# Patient Record
Sex: Female | Born: 1975
Health system: Southern US, Community
[De-identification: ages and names within clinical notes are randomized; demographics above are authoritative.]

## PROBLEM LIST (undated history)

## (undated) DIAGNOSIS — E669 Obesity, unspecified: Secondary | ICD-10-CM

## (undated) DIAGNOSIS — D500 Iron deficiency anemia secondary to blood loss (chronic): Secondary | ICD-10-CM

## (undated) HISTORY — PX: NO PAST SURGERIES: SHX2092

## (undated) HISTORY — DX: Iron deficiency anemia secondary to blood loss (chronic): D50.0

## (undated) HISTORY — DX: Obesity, unspecified: E66.9

## (undated) HISTORY — PX: WISDOM TOOTH EXTRACTION: SHX21

---

## 2010-12-27 ENCOUNTER — Inpatient Hospital Stay (INDEPENDENT_AMBULATORY_CARE_PROVIDER_SITE_OTHER)
Admission: RE | Admit: 2010-12-27 | Discharge: 2010-12-27 | Disposition: A | Discharge status: Home / Self Care | Payer: 59 | Source: Ambulatory Visit | Attending: Family Medicine | Admitting: Family Medicine

## 2010-12-27 ENCOUNTER — Encounter: Payer: Self-pay | Admitting: Family Medicine

## 2010-12-27 DIAGNOSIS — IMO0002 Reserved for concepts with insufficient information to code with codable children: Secondary | ICD-10-CM

## 2011-02-28 NOTE — Progress Notes (Signed)
Summary: left large toe injury 12-26-10 (procedure rm)   Vital Signs:  Patient Profile:   35 Years Old Female CC:      Left big toe injury yesterday Height:     64 inches Weight:      200 pounds O2 Sat:      100 % O2 treatment:    Room Air Temp:     98.6 degrees F oral Pulse rate:   90 / minute Resp:     16 per minute BP sitting:   127 / 80  (left arm) Cuff size:   regular  Vitals Entered By: Lavell Islam RN (December 27, 2010 5:21 PM)                  Prior Medication List:  No prior medications documented  Updated Prior Medication List: No Medications Current Allergies: No known allergies History of Present Illness Chief Complaint: Left big toe injury yesterday History of Present Illness:  Subjective:  Patient states that she scraped the tip of her left first toe last night on floor, resulting in small laceration/abrasion.  She washed the toe and applied Bacitracin and bandage.  Tetanus immunization current.  REVIEW OF SYSTEMS Constitutional Symptoms      Denies fever, chills, night sweats, weight loss, weight gain, and fatigue.  Eyes       Denies change in vision, eye pain, eye discharge, glasses, contact lenses, and eye surgery. Ear/Nose/Throat/Mouth       Denies hearing loss/aids, change in hearing, ear pain, ear discharge, dizziness, frequent runny nose, frequent nose bleeds, sinus problems, sore throat, hoarseness, and tooth pain or bleeding.  Respiratory       Denies dry cough, productive cough, wheezing, shortness of breath, asthma, bronchitis, and emphysema/COPD.  Cardiovascular       Denies murmurs, chest pain, and tires easily with exhertion.    Gastrointestinal       Denies stomach pain, nausea/vomiting, diarrhea, constipation, blood in bowel movements, and indigestion. Genitourniary       Denies painful urination, kidney stones, and loss of urinary control. Neurological       Denies paralysis, seizures, and fainting/blackouts. Musculoskeletal    Denies muscle pain, joint pain, joint stiffness, decreased range of motion, redness, swelling, muscle weakness, and gout.  Skin       Complains of unusual moles/lumps or sores.      Denies bruising and hair/skin or nail changes.  Psych       Denies mood changes, temper/anger issues, anxiety/stress, speech problems, depression, and sleep problems. Other Comments: left big toe injury yesterday; draining   Past History:  Past Medical History: Unremarkable  Past Surgical History: Denies surgical history  Family History: Family History Hypertension Family History Thyroid disease  Social History: Works for [employer] Never Smoked Alcohol use-no Drug use-no Smoking Status:  never Drug Use:  no   Objective:  No acute distress  Left first toe:  On medial aspect distally is a 1cm superficial skin flap that is well adhered to its base.  Toe has full range of motion, neurovascular intact.  No evidence infection.  Wound appears clean without debris. Assessment New Problems: ABRASION/FRICTION BURN FOOT&TOE W/O MENTION INF (ICD-917.0)  NO SUTURES NECESSARY.  NO EVIDENCE INFECTION  Plan New Orders: New Patient Level III [99203] Services provided After hours-Weekends-Holidays [99051] Planning Comments:   Applied bacitracin and bandage.  Advised to change bandage daily.  Return for any signs of infection.    The patient and/or caregiver  has been counseled thoroughly with regard to medications prescribed including dosage, schedule, interactions, rationale for use, and possible side effects and they verbalize understanding.  Diagnoses and expected course of recovery discussed and will return if not improved as expected or if the condition worsens. Patient and/or caregiver verbalized understanding.   Orders Added: 1)  New Patient Level III [40981] 2)  Services provided After hours-Weekends-Holidays [99051]

## 2011-03-05 ENCOUNTER — Emergency Department (INDEPENDENT_AMBULATORY_CARE_PROVIDER_SITE_OTHER)
Admission: EM | Admit: 2011-03-05 | Discharge: 2011-03-05 | Disposition: A | Discharge status: Home / Self Care | Payer: 59 | Source: Home / Self Care | Attending: Family Medicine | Admitting: Family Medicine

## 2011-03-05 ENCOUNTER — Encounter: Payer: Self-pay | Admitting: Emergency Medicine

## 2011-03-05 DIAGNOSIS — R6889 Other general symptoms and signs: Secondary | ICD-10-CM

## 2011-03-05 MED ORDER — OSELTAMIVIR PHOSPHATE 75 MG PO CAPS: 75.0000 mg | ORAL_CAPSULE | Freq: Two times a day (BID) | ORAL | Status: AC

## 2011-03-05 MED ORDER — BENZONATATE 200 MG PO CAPS: 200.0000 mg | ORAL_CAPSULE | Freq: Every day | ORAL | Status: AC

## 2011-03-05 NOTE — ED Notes (Signed)
Cough, fever, congestion x 24 hours; daughter currently has Flu and is on Tamiflu; patient had Flu Vaccine in September; took Tylenol at 0730 today.

## 2011-03-05 NOTE — ED Provider Notes (Signed)
History     CSN: [id number] Arrival date & time: 03/05/2011 10:26 AM   First MD Initiated Contact with Patient 03/05/11 1102      Chief Complaint  Patient presents with  . Nasal Congestion     HPI Comments: HPI : Flu symptoms for about 1 day. Fever to 101+ with chills, sweats, myalgias, fatigue, headache. Symptoms are progressively worsening, despite trying OTC fever reducing medicine and rest and fluids. Has decreased appetite, but tolerating some liquids by mouth.  She had a flu shot this season; her daughter was diagnosed with influenza 2 days ago, presently taking Tamiflu.  Review of Systems: Positive for fatigue, mild nasal congestion, mild sore throat, mild swollen anterior neck glands, mild cough. Negative for acute vision changes, stiff neck, focal weakness, syncope, seizures, respiratory distress, vomiting, diarrhea, GU symptoms.   The history is provided by the patient.    History reviewed. No pertinent past medical history.  History reviewed. No pertinent past surgical history.  Family History  Problem Relation Age of Onset  . Hypertension Mother     History  Substance Use Topics  . Smoking status: Never Smoker   . Smokeless tobacco: Not on file  . Alcohol Use: No    OB History    Grav Para Term Preterm Abortions TAB SAB Ect Mult Living                  Review of Systems  Allergies  Review of patient's allergies indicates no known allergies.  Home Medications   Current Outpatient Rx  Name Route Sig Dispense Refill  . BENZONATATE 200 MG PO CAPS Oral Take 1 capsule (200 mg total) by mouth at bedtime. Take as needed for cough 12 capsule 0  . OSELTAMIVIR PHOSPHATE 75 MG PO CAPS Oral Take 1 capsule (75 mg total) by mouth every 12 (twelve) hours. 10 capsule 0    BP 121/80  Pulse 115  Temp(Src) 99.7 F (37.6 C) (Oral)  Resp 16  Ht 5\' 4"  (1.626 m)  Wt 203 lb (92.08 kg)  BMI 34.84 kg/m2  SpO2 97%  LMP 02/28/2011  Physical Exam Nursing notes and  Vital Signs reviewed. Appearance:  Patient appears healthy, stated age, and in no acute distress.  Patient is obese (BMI 34.9) Eyes:  Pupils are equal, round, and reactive to light and accomodation.  Extraocular movement is intact.  Conjunctivae are not inflamed  Ears:  Canals normal.  Tympanic membranes normal.  Nose:  Mildly congested turbinates.  No sinus tenderness.   Pharynx:  Normal  Neck:  Supple.  No adenopathy is present.  Lungs:  Clear to auscultation.  Breath sounds are equal.  Heart:  Regular rate and rhythm without murmurs, rubs, or gallops.  Abdomen:  Nontender without masses or hepatosplenomegaly.  Bowel sounds are present.  No CVA or flank tenderness.  Extremities:  No edema.  Pedal pulses are full and equal.  Skin:  No rash present.    ED Course  Procedures  none      1. Influenza-like illness       MDM  Begin Tamiflu and cough suppressant at bedtime. Take Mucinex D (guaifenesin with decongestant) twice daily for congestion.  Increase fluid intake, rest. May use Afrin nasal spray (or generic oxymetazoline) twice daily for about 5 days.  Also recommend using saline nasal spray several times daily and/or saline nasal irrigation. Stop all antihistamines for now, and other non-prescription cough/cold preparations. May take Ibuprofen 200mg , 4 tabs every 8 hours  with food for fever, headache, chest/sternum discomfort, etc. Follow-up with family doctor if not improving 7 days.        Donna Christen, MD 03/07/11 Windy Fast

## 2011-05-17 ENCOUNTER — Ambulatory Visit (INDEPENDENT_AMBULATORY_CARE_PROVIDER_SITE_OTHER): Payer: 59 | Admitting: Obstetrics & Gynecology

## 2011-05-17 ENCOUNTER — Encounter: Payer: Self-pay | Admitting: Obstetrics & Gynecology

## 2011-05-17 VITALS — BP 127/78 | HR 81 | Temp 98.5°F | Resp 12 | Ht 64.0 in | Wt 197.1 lb

## 2011-05-17 DIAGNOSIS — Z113 Encounter for screening for infections with a predominantly sexual mode of transmission: Secondary | ICD-10-CM

## 2011-05-17 DIAGNOSIS — Z0000 Encounter for general adult medical examination without abnormal findings: Secondary | ICD-10-CM

## 2011-05-17 DIAGNOSIS — Z1272 Encounter for screening for malignant neoplasm of vagina: Secondary | ICD-10-CM

## 2011-05-17 NOTE — Progress Notes (Signed)
Subjective:    Joanna Hayes is a 36 y.o. MAA female who presents for an annual exam. The patient has no complaints today. The patient is sexually active. GYN screening history: last pap: was normal. The patient wears seatbelts: yes. The patient participates in regular exercise: yes. Has the patient ever been transfused or tattooed?: yes.(tattoo) The patient reports that there is not domestic violence in her life.   Menstrual History: OB History    Grav Para Term Preterm Abortions TAB SAB Ect Mult Living   2 2              Menarche age: 9 Patient's last menstrual period was 04/29/2011.    The following portions of the patient's history were reviewed and updated as appropriate: allergies, current medications, past family history, past medical history, past social history, past surgical history and problem list.  Review of Systems A comprehensive review of systems was negative. She has been married for 10 years and denies dysparunia. She has a 24 yo daughter and a 57 yo son. 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   Objective:    BP 127/78  Pulse 81  Temp(Src) 98.5 F (36.9 C) (Oral)  Resp 12  Ht 5\' 4"  (1.626 m)  Wt 197 lb 1.9 oz (89.413 kg)  BMI 33.84 kg/m2  LMP 04/29/2011  General Appearance:    Alert, cooperative, no distress, appears stated age  Head:    Normocephalic, without obvious abnormality, atraumatic  Eyes:    PERRL, conjunctiva/corneas clear, EOM's intact, fundi    benign, both eyes  Ears:    Normal TM's and external ear canals, both ears  Nose:   Nares normal, septum midline, mucosa normal, no drainage    or sinus tenderness  Throat:   Lips, mucosa, and tongue normal; teeth and gums normal  Neck:   Supple, symmetrical, trachea midline, no adenopathy;    thyroid:  no enlargement/tenderness/nodules; no carotid   bruit or JVD  Back:     Symmetric, no curvature, ROM normal, no CVA tenderness  Lungs:     Clear to auscultation bilaterally, respirations unlabored   Chest Wall:    No tenderness or deformity   Heart:    Regular rate and rhythm, S1 and S2 normal, no murmur, rub   or gallop  Breast Exam:    No tenderness, masses, or nipple abnormality  Abdomen:     Soft, non-tender, bowel sounds active all four quadrants,    no masses, no organomegaly  Genitalia:    Normal female without lesion, discharge or tenderness, NSSA, NT, no adnexal masses     Extremities:   Extremities normal, atraumatic, no cyanosis or edema  Pulses:   2+ and symmetric all extremities  Skin:   Skin color, texture, turgor normal, no rashes or lesions  Lymph nodes:   Cervical, supraclavicular, and axillary nodes normal  Neurologic:   CNII-XII intact, normal strength, sensation and reflexes    throughout  .    Assessment:    Healthy female exam.    Plan:     Mammogram. Pap smear.

## 2012-06-10 ENCOUNTER — Emergency Department
Admission: EM | Admit: 2012-06-10 | Discharge: 2012-06-10 | Disposition: A | Discharge status: Home / Self Care | Payer: 59 | Source: Home / Self Care | Attending: Family Medicine | Admitting: Family Medicine

## 2012-06-10 ENCOUNTER — Emergency Department (INDEPENDENT_AMBULATORY_CARE_PROVIDER_SITE_OTHER): Payer: 59

## 2012-06-10 DIAGNOSIS — M7520 Bicipital tendinitis, unspecified shoulder: Secondary | ICD-10-CM

## 2012-06-10 DIAGNOSIS — M25511 Pain in right shoulder: Secondary | ICD-10-CM

## 2012-06-10 DIAGNOSIS — M25519 Pain in unspecified shoulder: Secondary | ICD-10-CM

## 2012-06-10 MED ORDER — MELOXICAM 15 MG PO TABS: 15.0000 mg | ORAL_TABLET | Freq: Every day | ORAL | Status: DC

## 2012-06-10 MED ORDER — CYCLOBENZAPRINE HCL 10 MG PO TABS: ORAL_TABLET | ORAL | Status: DC

## 2012-06-10 NOTE — ED Notes (Signed)
Joanna Hayes had pulled her right shoulder 1 week ago. The pain did not last long until yesterday she pulled it again. Now the pain is stabbing and is a 9/10 with movement.

## 2012-06-10 NOTE — ED Provider Notes (Signed)
History     CSN: [id number]  Arrival date & time 06/10/12  1606   First MD Initiated Contact with Patient 06/10/12 1618      Chief Complaint  Patient presents with  . Shoulder Pain    x 1 week       HPI Comments: While playing with her husband one week ago, patient felt a pulling sensation in her right shoulder.  The pain gradually improved, then yesterday while playing with a heavy infant she had recurrent right shoulder pain   Patient is a 37 y.o. female presenting with shoulder pain. The history is provided by the patient.  Shoulder Pain This is a new problem. Episode onset: 1 week ago. The problem occurs constantly. The problem has been gradually worsening. Associated symptoms comments: none. Exacerbated by: raising right arm. Nothing relieves the symptoms. Treatments tried: Ibuprofen. The treatment provided mild relief.    Past Medical History  Diagnosis Date  . Obesity     History reviewed. No pertinent past surgical history.  Family History  Problem Relation Age of Onset  . Hypertension Mother   . Hypertension Father   . Diabetes Maternal Grandfather   . Colon cancer Paternal Grandfather     History  Substance Use Topics  . Smoking status: Never Smoker   . Smokeless tobacco: Not on file  . Alcohol Use: No    OB History   Grav Para Term Preterm Abortions TAB SAB Ect Mult Living   2 2              Review of Systems  All other systems reviewed and are negative.    Allergies  Review of patient's allergies indicates no known allergies.  Home Medications   Current Outpatient Rx  Name  Route  Sig  Dispense  Refill  . cyclobenzaprine (FLEXERIL) 10 MG tablet      Take one tab by mouth each evening   15 tablet   0   . meloxicam (MOBIC) 15 MG tablet   Oral   Take 1 tablet (15 mg total) by mouth daily. Take with food each morning   15 tablet   0     BP 116/76  Pulse 90  Temp(Src) 98.7 F (37.1 C) (Oral)  Ht 5\' 4"  (1.626 m)  Wt 206 lb (93.441  kg)  BMI 35.34 kg/m2  SpO2 96%  Physical Exam  Nursing note and vitals reviewed. Constitutional: She is oriented to person, place, and time. She appears well-developed and well-nourished. No distress.  Patient is obese (BMI 35.3)  HENT:  Head: Atraumatic.  Eyes: Conjunctivae and EOM are normal. Pupils are equal, round, and reactive to light.  Neck: Normal range of motion.  Cardiovascular: Normal heart sounds.   Pulmonary/Chest: Breath sounds normal.  Musculoskeletal:       Right shoulder: She exhibits decreased range of motion, tenderness and pain. She exhibits no bony tenderness, no swelling, no effusion, no crepitus, no deformity, no spasm, normal pulse and normal strength.  Patient has pain with abduction of right shoulder but is able to fully abduct.  She also has decreased external rotation and internal rotation (+ lift off test).  There is tenderness over insertion of the long head of biceps tendon on right. Distal neurovascular function is intact.   Neurological: She is alert and oriented to person, place, and time.  Skin: Skin is warm and dry. No rash noted.    ED Course  Procedures  none   Dg Shoulder Right  06/10/2012  *RADIOLOGY REPORT*  Clinical Data: 1-week history of right shoulder pain.  RIGHT SHOULDER - 2+ VIEW  Comparison: None.  Findings: No evidence of acute fracture or glenohumeral dislocation.  Subacromial space well preserved.  Acromioclavicular joint intact without significant degenerative change.  No intrinsic osseous abnormalities.  IMPRESSION: Normal examination.   Original Report Authenticated By: Hulan Saas, M.D.      1. Right shoulder pain;  ? Rotator cuff strain   2. Biceps tendonitis, right       MDM   Begin Mobic.  Begin Flexeril at bedtime. Apply ice pack to shoulder two or three times daily.  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 Tyson Babinski, MD 06/11/12 [phone]

## 2012-06-13 ENCOUNTER — Ambulatory Visit (INDEPENDENT_AMBULATORY_CARE_PROVIDER_SITE_OTHER): Payer: 59 | Admitting: Sports Medicine

## 2012-06-13 ENCOUNTER — Telehealth: Payer: Self-pay

## 2012-06-13 VITALS — BP 119/77 | HR 86 | Wt 210.0 lb

## 2012-06-13 DIAGNOSIS — S46819A Strain of other muscles, fascia and tendons at shoulder and upper arm level, unspecified arm, initial encounter: Secondary | ICD-10-CM

## 2012-06-13 DIAGNOSIS — S46811A Strain of other muscles, fascia and tendons at shoulder and upper arm level, right arm, initial encounter: Secondary | ICD-10-CM | POA: Insufficient documentation

## 2012-06-13 DIAGNOSIS — M5416 Radiculopathy, lumbar region: Secondary | ICD-10-CM | POA: Insufficient documentation

## 2012-06-13 NOTE — Assessment & Plan Note (Signed)
Overall improving. We were conservatively with Mobic, home PT, I would like to see her back in 3 weeks. No better we can consider injection with ultrasound scan.

## 2012-06-13 NOTE — Progress Notes (Signed)
  Subjective:    I'm seeing this patient as a consultation for:  Dr. 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 A few weeks ago she jerked her arm back causing pain in the anterior aspect of her shoulder. He got a little better but unfortunately started to get worse again. She was seen in urgent care given to rotator cuff exercises, and given some Mobic. This is overall helping the pain seems to be improving little by little.  She localized pain at the anterior joint line, worse when reaching behind her back, but not so bad with overhead activities. She denies any pain in the neck and denies any pain going down her hand causing numbness or tingling in her fingertips.  Past medical history, Surgical history, Family history not pertinant except as noted below, Social history, Allergies, and medications have been entered into the medical record, reviewed, and no changes needed.   Review of Systems: No headache, visual changes, nausea, vomiting, diarrhea, constipation, dizziness, abdominal pain, skin rash, fevers, chills, night sweats, weight loss, swollen lymph nodes, body aches, joint swelling, muscle aches, chest pain, shortness of breath, mood changes, visual or auditory hallucinations.   Objective:   General: Well Developed, well nourished, and in no acute distress.  Neuro/Psych: Alert and oriented x3, extra-ocular muscles intact, able to move all 4 extremities, sensation grossly intact. Skin: Warm and dry, no rashes noted.  Respiratory: Not using accessory muscles, speaking in full sentences, trachea midline.  Cardiovascular: Pulses palpable, no extremity edema. Abdomen: Does not appear distended. Right Shoulder: Inspection reveals no abnormalities, atrophy or asymmetry. Tender to palpation of her anterior joint line and over the bicipital groove. ROM is full in all planes. Rotator cuff strength weak to  lift off test. No signs of impingement with negative Neer and Hawkin's tests, empty can sign. Speeds and Yergason's tests normal. No labral pathology noted with negative Obrien's, negative clunk and good stability. Normal scapular function observed. No painful arc and no drop arm sign. No apprehension sign Impression and Recommendations:   This case required medical decision making of moderate complexity.

## 2012-06-13 NOTE — ED Notes (Signed)
Left a message on voice mail asking how patient is feeling and advising to call back with any questions or concerns.  

## 2012-07-06 ENCOUNTER — Ambulatory Visit (INDEPENDENT_AMBULATORY_CARE_PROVIDER_SITE_OTHER): Payer: 59 | Admitting: Sports Medicine

## 2012-07-06 ENCOUNTER — Encounter: Payer: Self-pay | Admitting: Sports Medicine

## 2012-07-06 VITALS — BP 111/76 | HR 91 | Wt 212.0 lb

## 2012-07-06 DIAGNOSIS — S46811D Strain of other muscles, fascia and tendons at shoulder and upper arm level, right arm, subsequent encounter: Secondary | ICD-10-CM

## 2012-07-06 DIAGNOSIS — S46819A Strain of other muscles, fascia and tendons at shoulder and upper arm level, unspecified arm, initial encounter: Secondary | ICD-10-CM

## 2012-07-06 NOTE — Assessment & Plan Note (Signed)
Pain resolved. Rotator cuff function is completely normal. Return on an as-needed basis. Continue home exercises.

## 2012-07-06 NOTE — Progress Notes (Signed)
   Subjective:    CC: Followup  HPI: Right subscapularis strain: Resolved with physical therapy.  Past medical history, Surgical history, Family history not pertinant except as noted below, Social history, Allergies, and medications have been entered into the medical record, reviewed, and no changes needed.   Review of Systems: No headache, visual changes, nausea, vomiting, diarrhea, constipation, dizziness, abdominal pain, skin rash, fevers, chills, night sweats, weight loss, swollen lymph nodes, body aches, joint swelling, muscle aches, chest pain, shortness of breath, mood changes, visual or auditory hallucinations.   Objective:   General: Well Developed, well nourished, and in no acute distress.  Neuro/Psych: Alert and oriented x3, extra-ocular muscles intact, able to move all 4 extremities, sensation grossly intact. Skin: Warm and dry, no rashes noted.  Respiratory: Not using accessory muscles, speaking in full sentences, trachea midline.  Cardiovascular: Pulses palpable, no extremity edema. Abdomen: Does not appear distended. Right Shoulder: Inspection reveals no abnormalities, atrophy or asymmetry. Palpation is normal with no tenderness over AC joint or bicipital groove. ROM is full in all planes. Rotator cuff strength normal throughout. No signs of impingement with negative Neer and Hawkin's tests, empty can sign. Speeds and Yergason's tests normal. No labral pathology noted with negative Obrien's, negative clunk and good stability. Normal scapular function observed. No painful arc and no drop arm sign. No apprehension sign. Impression and Recommendations:   This case required medical decision making of moderate complexity.

## 2013-01-03 ENCOUNTER — Other Ambulatory Visit (HOSPITAL_COMMUNITY)
Admission: RE | Admit: 2013-01-03 | Discharge: 2013-01-03 | Disposition: A | Discharge status: Home / Self Care | Payer: 59 | Source: Ambulatory Visit | Attending: Obstetrics & Gynecology | Admitting: Obstetrics & Gynecology

## 2013-01-03 ENCOUNTER — Ambulatory Visit (INDEPENDENT_AMBULATORY_CARE_PROVIDER_SITE_OTHER): Payer: 59 | Admitting: Obstetrics & Gynecology

## 2013-01-03 ENCOUNTER — Encounter: Payer: Self-pay | Admitting: Obstetrics & Gynecology

## 2013-01-03 VITALS — BP 127/77 | HR 93 | Resp 16 | Ht 64.0 in | Wt 212.0 lb

## 2013-01-03 DIAGNOSIS — Z1151 Encounter for screening for human papillomavirus (HPV): Secondary | ICD-10-CM

## 2013-01-03 DIAGNOSIS — Z01419 Encounter for gynecological examination (general) (routine) without abnormal findings: Secondary | ICD-10-CM

## 2013-01-03 DIAGNOSIS — Z124 Encounter for screening for malignant neoplasm of cervix: Secondary | ICD-10-CM

## 2013-01-03 DIAGNOSIS — N841 Polyp of cervix uteri: Secondary | ICD-10-CM | POA: Insufficient documentation

## 2013-01-03 NOTE — Progress Notes (Signed)
Subjective:    Joanna Hayes is a 37 y.o. female who presents for an annual exam. The patient has no complaints today. The patient is sexually active. GYN screening history: last pap: was normal. The patient wears seatbelts: yes. The patient participates in regular exercise: yes. Has the patient ever been transfused or tattooed?: yes. (tattoo) The patient reports that there is not domestic violence in her life.   Menstrual History: OB History   Grav Para Term Preterm Abortions TAB SAB Ect Mult Living   2 2              Menarche age: 78  Patient's last menstrual period was 12/11/2012.    The following portions of the patient's history were reviewed and updated as appropriate: allergies, current medications, past family history, past medical history, past social history, past surgical history and problem list.  Review of Systems A comprehensive review of systems was negative. 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 She had her flu vaccine yesterday.   Objective:    BP 127/77  Pulse 93  Resp 16  Ht 5\' 4"  (1.626 m)  Wt 212 lb (96.163 kg)  BMI 36.37 kg/m2  LMP 12/11/2012  General Appearance:    Alert, cooperative, no distress, appears stated age  Head:    Normocephalic, without obvious abnormality, atraumatic  Eyes:    PERRL, conjunctiva/corneas clear, EOM's intact, fundi    benign, both eyes  Ears:    Normal TM's and external ear canals, both ears  Nose:   Nares normal, septum midline, mucosa normal, no drainage    or sinus tenderness  Throat:   Lips, mucosa, and tongue normal; teeth and gums normal  Neck:   Supple, symmetrical, trachea midline, no adenopathy;    thyroid:  no enlargement/tenderness/nodules; no carotid   bruit or JVD  Back:     Symmetric, no curvature, ROM normal, no CVA tenderness  Lungs:     Clear to auscultation bilaterally, respirations unlabored  Chest Wall:    No tenderness or deformity   Heart:    Regular rate and rhythm, S1 and S2 normal, no murmur, rub    or gallop  Breast Exam:    No tenderness, masses, or nipple abnormality  Abdomen:     Soft, non-tender, bowel sounds active all four quadrants,    no masses, no organomegaly  Genitalia:    Normal female without lesion, discharge or tenderness, NSSA, NT, mobile, normal adnexal exam     Extremities:   Extremities normal, atraumatic, no cyanosis or edema  Pulses:   2+ and symmetric all extremities  Skin:   Skin color, texture, turgor normal, no rashes or lesions  Lymph nodes:   Cervical, supraclavicular, and axillary nodes normal  Neurologic:   CNII-XII intact, normal strength, sensation and reflexes    throughout  .    Assessment:    Healthy female exam.    Plan:     Breast self exam technique reviewed and patient encouraged to perform self-exam monthly. Mammogram. Thin prep Pap smear. with HPV cotesting

## 2013-01-03 NOTE — Patient Instructions (Signed)
Mammogram Tips Healthy women should begin getting mammograms every year or two once they reach age 35, and once a year when they reach age 50. Here are tips:  Find an experienced, high-volume center with accomplished radiologists. You can ask for their credentials.  Ask to see the certificate showing the center is approved by the U[place] Food and Drug Administration.  Use the same center regularly, so it is easier to compare your new mammograms with your old ones.  Bring a list of places you have had mammograms, dates, biopsies or other breast treatments. Bring old mammograms with you or have them sent to your primary caregiver.  Describe any breast problems to your caregiver or the person doing the mammogram. Be ready to give past surgeries, birth control pills, hormone use, breast implants, growths, moles, breast scars and family or personal history of breast cancer.  Call your doctor or center to check on the mammogram if you hear nothing within 10 days. Do not assume everything was normal.  To protect your privacy, the mammogram results cannot be given over the phone or to anyone but you.  Radiation from a mammogram is very low and does not pose a radiation risk.  Mammograms can detect breast problems other than breast cancer.  You may be asked stand or sit in front of the X-ray machine.  Two small plastic or glass plates are placed around the breast when taking the X-ray.  If you are menstruating, schedule your mammogram a week after your menstrual period.  Do not wear deodorants, powder or perfume when getting a mammogram.  Wash your breasts and under your arms before getting a mammogram.  Wear cloths that are easy for you to undress and dress.  Arrive at the center at least 15 minutes before the mammogram is scheduled.  There may be slight discomfort during the mammogram, but it goes away shortly after the test.  Try to relax as much as possible during the mammogram.  Talk  to your caregiver if you do not understand the results of the mammogram.  Follow the recommendations of your caregiver regarding further tests and treatments if needed.  Get a second opinion if you are concerned or question the results of the mammogram, further tests or treatment if needed.  Continue with monthly self-breast exams and yearly caregiver exams even if the mammogram is normal.  Your caregiver may recommend getting a mammogram before age 35 and more often if you are at high risk for developing breast cancer. Document Released: 06/30/2005 Document Revised: 06/06/2011 Document Reviewed: 03/07/2008 [employer] Patient Information 2014 [employer].  

## 2013-04-03 ENCOUNTER — Encounter: Payer: Self-pay | Admitting: Family Medicine

## 2013-04-03 ENCOUNTER — Ambulatory Visit (INDEPENDENT_AMBULATORY_CARE_PROVIDER_SITE_OTHER): Payer: 59 | Admitting: Family Medicine

## 2013-04-03 VITALS — BP 119/76 | HR 78 | Temp 98.2°F | Ht 64.0 in | Wt 212.0 lb

## 2013-04-03 DIAGNOSIS — A499 Bacterial infection, unspecified: Secondary | ICD-10-CM

## 2013-04-03 DIAGNOSIS — J329 Chronic sinusitis, unspecified: Secondary | ICD-10-CM

## 2013-04-03 DIAGNOSIS — Z13220 Encounter for screening for lipoid disorders: Secondary | ICD-10-CM

## 2013-04-03 DIAGNOSIS — Z131 Encounter for screening for diabetes mellitus: Secondary | ICD-10-CM

## 2013-04-03 DIAGNOSIS — B9689 Other specified bacterial agents as the cause of diseases classified elsewhere: Secondary | ICD-10-CM

## 2013-04-03 MED ORDER — AMOXICILLIN-POT CLAVULANATE 500-125 MG PO TABS: ORAL_TABLET | ORAL | Status: AC

## 2013-04-03 NOTE — Progress Notes (Signed)
CC: Joanna Hayes is a 38 y.o. female is here for Establish Care and Sinusitis   Subjective: HPI:  Very pleasant 38 year old limited past medical history, presents to establish care  Complains of facial pressure and nasal discharge that has been present for the last week worsening on a daily basis it appears that there is a rebound where it started to get better on the weekend only to worsen over the past 3 days significantly. Now moderate in severity. Pain is localized between the eyes radiates beneath the eyes. Pain is worse in the evening when lying Down and accompanied by subjective postnasal drip worse in the evening. Nonproductive cough in the evening only. Symptoms have not been improved with Alka-Seltzer cold or DayQuil.  Reports fever 100.1 yesterday. Denies chills or night sweats.   Review of Systems - General ROS: negative for - chills, night sweats, weight gain or weight loss Ophthalmic ROS: negative for - decreased vision Psychological ROS: negative for - anxiety or depression ENT ROS: negative for - hearing change,tinnitus or allergies Hematological and Lymphatic ROS: negative for - bleeding problems, bruising or swollen lymph nodes Breast ROS: negative Respiratory ROS: no shortness of breath, or wheezing Cardiovascular ROS: no chest pain or dyspnea on exertion Gastrointestinal ROS: no abdominal pain, change in bowel habits, or black or bloody stools Genito-Urinary ROS: negative for - genital discharge, genital ulcers, incontinence or abnormal bleeding from genitals Musculoskeletal ROS: negative for - joint pain or muscle pain Neurological ROS: negative for - headaches or memory loss Dermatological ROS: negative for lumps, mole changes, rash and skin lesion changes  Past Medical History  Diagnosis Date  . Obesity      Family History  Problem Relation Age of Onset  . Hypertension Mother   . Hypertension Father   . Diabetes Maternal Grandfather   . Colon cancer Paternal  Grandfather      History  Substance Use Topics  . Smoking status: Never Smoker   . Smokeless tobacco: Not on file  . Alcohol Use: No     Objective: Filed Vitals:   04/03/13 1317  BP: 119/76  Pulse: 78  Temp: 98.2 F (36.8 C)    General: Alert and Oriented, No Acute Distress HEENT: Pupils equal, round, reactive to light. Conjunctivae clear.  External ears unremarkable, canals clear with intact TMs with appropriate landmarks.  Left Middle ear appears open without effusion, right middle ear with mild serous effusion. Pink inferior turbinates.  Moist mucous membranes, pharynx without inflammation nor lesions however moderate cobblestoning.  Neck supple without palpable lymphadenopathy nor abnormal masses. Lungs: Clear to auscultation bilaterally, no wheezing/ronchi/rales.  Comfortable work of breathing. Good air movement. Neuro: Cranial nerves II through XII grossly intact Mental Status: No depression, anxiety, nor agitation. Skin: Warm and dry.  Assessment & Plan: Joanna Hayes was seen today for establish care and sinusitis.  Diagnoses and associated orders for this visit:  Bacterial sinusitis - amoxicillin-clavulanate (AUGMENTIN) 500-125 MG per tablet; Take one by mouth every 8 hours for ten total days.  Lipid screening - Lipid panel  Diabetes mellitus screening - BASIC METABOLIC PANEL WITH GFR    Bacterial sinusitis: Start Augmentin consider continuing over-the-counter preparation such as NyQuil in the evening to help with sleep and preventing morning symptoms, consider nasal saline washes  sReturn if symptoms worsen or fail to improve.

## 2013-10-15 ENCOUNTER — Other Ambulatory Visit: Payer: Self-pay | Admitting: Family Medicine

## 2013-10-16 ENCOUNTER — Encounter: Payer: Self-pay | Admitting: Family Medicine

## 2013-10-16 DIAGNOSIS — E785 Hyperlipidemia, unspecified: Secondary | ICD-10-CM | POA: Insufficient documentation

## 2013-10-16 LAB — LIPID PANEL
CHOLESTEROL: 179 mg/dL (ref 0–200)
HDL: 49 mg/dL (ref >39)
LDL Cholesterol: 120 mg/dL — ABNORMAL HIGH (ref 0–99)
Total CHOL/HDL Ratio: 3.7 Ratio
Triglycerides: 52 mg/dL (ref <150)
VLDL: 10 mg/dL (ref 0–40)

## 2013-10-16 LAB — BASIC METABOLIC PANEL WITH GFR
BUN: 13 mg/dL (ref 6–23)
CALCIUM: 9.2 mg/dL (ref 8.4–10.5)
CO2: 23 mEq/L (ref 19–32)
CREATININE: 0.85 mg/dL (ref 0.50–1.10)
Chloride: 106 mEq/L (ref 96–112)
GFR, EST NON AFRICAN AMERICAN: 89 mL/min
GFR, Est African American: >89 mL/min
Glucose, Bld: 91 mg/dL (ref 70–99)
Potassium: 4.2 mEq/L (ref 3.5–5.3)
Sodium: 138 mEq/L (ref 135–145)

## 2014-01-27 ENCOUNTER — Encounter: Payer: Self-pay | Admitting: Family Medicine

## 2014-06-25 ENCOUNTER — Ambulatory Visit (INDEPENDENT_AMBULATORY_CARE_PROVIDER_SITE_OTHER): Payer: 59 | Admitting: Family Medicine

## 2014-06-25 ENCOUNTER — Encounter: Payer: Self-pay | Admitting: Family Medicine

## 2014-06-25 VITALS — BP 120/76 | HR 98 | Wt 208.0 lb

## 2014-06-25 DIAGNOSIS — R0602 Shortness of breath: Secondary | ICD-10-CM

## 2014-06-25 DIAGNOSIS — R609 Edema, unspecified: Secondary | ICD-10-CM

## 2014-06-25 LAB — CBC
HCT: 37.7 % (ref 36.0–46.0)
Hemoglobin: 12.2 g/dL (ref 12.0–15.0)
MCH: 29.5 pg (ref 26.0–34.0)
MCHC: 32.4 g/dL (ref 30.0–36.0)
MCV: 91.3 fL (ref 78.0–100.0)
MPV: 9.7 fL (ref 8.6–12.4)
Platelets: 347 10*3/uL (ref 150–400)
RBC: 4.13 MIL/uL (ref 3.87–5.11)
RDW: 13.8 % (ref 11.5–15.5)
WBC: 7.7 10*3/uL (ref 4.0–10.5)

## 2014-06-25 NOTE — Progress Notes (Signed)
CC: Joanna Hayes is a 39 y.o. female is here for leg edema since sept   Subjective: HPI:  Swelling of both lower extremities that has been present for the last 6 months on a daily basis. Fluctuates from trace to mild in severity. Noticed at the end of the day. It improves if she keeps her legs elevated, it will resolve temporarily if legs are elevated for an hour or more. She denies edema or swelling elsewhere. Nothing else particularly makes it better or worse. She's never dealt with this before prior to onset above. Denies orthopnea, irregular heartbeat or chest pain. She is concerned that this could be a reflection of heart failure. Reviews of systems is positive for shortness of breath however this is only present when finding stairs and not predictable, sometimes there situations where she can climb the same flight of stairs without any shortness of breath whatsoever.   Review Of Systems Outlined In HPI  Past Medical History  Diagnosis Date  . Obesity     No past surgical history on file. Family History  Problem Relation Age of Onset  . Hypertension Mother   . Hypertension Father   . Diabetes Maternal Grandfather   . Colon cancer Paternal Grandfather     History   Social History  . Marital Status: Married    Spouse Name: N/A  . Number of Children: N/A  . Years of Education: N/A   Occupational History  . Not on file.   Social History Main Topics  . Smoking status: Never Smoker   . Smokeless tobacco: Not on file  . Alcohol Use: No  . Drug Use: No  . Sexual Activity:    Partners: Male    Birth Control/ Protection: Surgical   Other Topics Concern  . Not on file   Social History Narrative     Objective: BP 120/76 mmHg  Pulse 98  Wt 208 lb (94.348 kg)  General: Alert and Oriented, No Acute Distress HEENT: Pupils equal, round, reactive to light. Conjunctivae clear.  Moist mucous membranes Lungs: Clear to auscultation bilaterally, no wheezing/ronchi/rales.   Comfortable work of breathing. Good air movement. Cardiac: Regular rate and rhythm. Normal S1/S2.  No murmurs, rubs, nor gallops.   Extremities: Trace pitting edema in the ankles and shin, symmetric bilaterally.  Strong peripheral pulses.  Mental Status: No depression, anxiety, nor agitation. Skin: Warm and dry.  Assessment & Plan: Tasmine was seen today for leg edema since sept.  Diagnoses and all orders for this visit:  SOB (shortness of breath) Orders: -     CBC -     TSH  Edema Orders: -     CBC -     TSH   Edema: Very low suspicion for heart failure, reassurance provided based on clinical exam. Discussed most likely venous insufficiency however will rule out hypothyroidism and in some cases low blood cell counts can cause this due to decreased osmotic effect in the veins. Joint decision to not check renal function since it was perfect back in July  Return if symptoms worsen or fail to improve.

## 2014-06-26 ENCOUNTER — Telehealth: Payer: Self-pay | Admitting: Family Medicine

## 2014-06-26 LAB — TSH: TSH: 0.872 u[IU]/mL (ref 0.350–4.500)

## 2014-06-26 MED ORDER — FUROSEMIDE 20 MG PO TABS: 20.0000 mg | ORAL_TABLET | Freq: Every day | ORAL | Status: DC | PRN

## 2014-06-26 NOTE — Telephone Encounter (Signed)
Spoke with patient & gave message .Marland KitchenDirece

## 2014-06-26 NOTE — Telephone Encounter (Signed)
Seth Bake, Will you please let patient know that her blood cell counts and thyroid function were normal.  I suspect her swelling is from venous insufficiency.  This can be treated with reducing daily intake of sodium, wearing OTC compression stockings, or taking furosemide on a as needed basis.  I've sent an Rx of this to her [facility] on [address].

## 2016-01-17 DIAGNOSIS — H52223 Regular astigmatism, bilateral: Secondary | ICD-10-CM | POA: Diagnosis not present

## 2016-04-04 ENCOUNTER — Ambulatory Visit (INDEPENDENT_AMBULATORY_CARE_PROVIDER_SITE_OTHER): Payer: 59 | Admitting: Sports Medicine

## 2016-04-04 DIAGNOSIS — J029 Acute pharyngitis, unspecified: Secondary | ICD-10-CM | POA: Insufficient documentation

## 2016-04-04 LAB — POCT RAPID STREP A (OFFICE): Rapid Strep A Screen: NEGATIVE

## 2016-04-04 NOTE — Assessment & Plan Note (Signed)
No muscle aches, body aches, fatigue. Negative strep test. Ibuprofen 800 mg 3 times a day, salt water gargles, keep hydrated. Return as needed.

## 2016-04-04 NOTE — Progress Notes (Signed)
  Subjective:    CC: Sore throat  HPI: This is a pleasant 41 year old female, for the past couple of days she's had increasing sore throat, no cough, no runny nose, no muscle aches, body aches, no fevers or chills. No vomiting, diarrhea, no skin rash.  Past medical history:  Negative.  See flowsheet/record as well for more information.  Surgical history: Negative.  See flowsheet/record as well for more information.  Family history: Negative.  See flowsheet/record as well for more information.  Social history: Negative.  See flowsheet/record as well for more information.  Allergies, and medications have been entered into the medical record, reviewed, and no changes needed.   Review of Systems: No fevers, chills, night sweats, weight loss, chest pain, or shortness of breath.   Objective:    General: Well Developed, well nourished, and in no acute distress.  Neuro: Alert and oriented x3, extra-ocular muscles intact, sensation grossly intact.  HEENT: Normocephalic, atraumatic, pupils equal round reactive to light, neck supple, no masses, no lymphadenopathy, thyroid nonpalpable. Oropharynx, nasopharynx, ear canals unremarkable. Skin: Warm and dry, no rashes. Cardiac: Regular rate and rhythm, no murmurs rubs or gallops, no lower extremity edema.  Respiratory: Clear to auscultation bilaterally. Not using accessory muscles, speaking in full sentences.  Impression and Recommendations:    Viral pharyngitis No muscle aches, body aches, fatigue. Negative strep test. Ibuprofen 800 mg 3 times a day, salt water gargles, keep hydrated. Return as needed.

## 2016-04-04 NOTE — Patient Instructions (Signed)
Sore Throat A sore throat is pain, burning, irritation, or scratchiness in the throat. When you have a sore throat, you may feel pain or tenderness in your throat when you swallow or talk. Many things can cause a sore throat, including:  An infection.  Seasonal allergies.  Dryness in the air.  Irritants, such as smoke or pollution.  Gastroesophageal reflux disease (GERD).  A tumor. A sore throat is often the first sign of another sickness. It may happen with other symptoms, such as coughing, sneezing, fever, and swollen neck glands. Most sore throats go away without medical treatment. Follow these instructions at home:  Take over-the-counter medicines only as told by your health care provider.  Drink enough fluids to keep your urine clear or pale yellow.  Rest as needed.  To help with pain, try:  Sipping warm liquids, such as broth, herbal tea, or warm water.  Eating or drinking cold or frozen liquids, such as frozen ice pops.  Gargling with a salt-water mixture 3-4 times a day or as needed. To make a salt-water mixture, completely dissolve -1 tsp of salt in 1 cup of warm water.  Sucking on hard candy or throat lozenges.  Putting a cool-mist humidifier in your bedroom at night to moisten the air.  Sitting in the bathroom with the door closed for 5-10 minutes while you run hot water in the shower.  Do not use any tobacco products, such as cigarettes, chewing tobacco, and e-cigarettes. If you need help quitting, ask your health care provider. Contact a health care provider if:  You have a fever for more than 2-3 days.  You have symptoms that last (are persistent) for more than 2-3 days.  Your throat does not get better within 7 days.  You have a fever and your symptoms suddenly get worse. Get help right away if:  You have difficulty breathing.  You cannot swallow fluids, soft foods, or your saliva.  You have increased swelling in your throat or neck.  You have  persistent nausea and vomiting. This information is not intended to replace advice given to you by your health care provider. Make sure you discuss any questions you have with your health care provider. Document Released: 04/21/2004 Document Revised: 11/08/2015 Document Reviewed: 01/02/2015 Elsevier Interactive Patient Education  2017 [employer]  

## 2016-04-11 ENCOUNTER — Ambulatory Visit (INDEPENDENT_AMBULATORY_CARE_PROVIDER_SITE_OTHER): Payer: 59 | Admitting: Physician Assistant

## 2016-04-11 ENCOUNTER — Encounter: Payer: Self-pay | Admitting: Physician Assistant

## 2016-04-11 VITALS — BP 113/73 | HR 91 | Wt 200.0 lb

## 2016-04-11 DIAGNOSIS — E669 Obesity, unspecified: Secondary | ICD-10-CM | POA: Diagnosis not present

## 2016-04-11 DIAGNOSIS — M545 Low back pain, unspecified: Secondary | ICD-10-CM

## 2016-04-11 DIAGNOSIS — E785 Hyperlipidemia, unspecified: Secondary | ICD-10-CM

## 2016-04-11 DIAGNOSIS — Z0000 Encounter for general adult medical examination without abnormal findings: Secondary | ICD-10-CM | POA: Diagnosis not present

## 2016-04-11 LAB — LIPID PANEL
Cholesterol: 175 mg/dL (ref <200)
HDL: 39 mg/dL — ABNORMAL LOW (ref >50)
LDL Cholesterol: 121 mg/dL — ABNORMAL HIGH (ref <100)
TRIGLYCERIDES: 75 mg/dL (ref <150)
Total CHOL/HDL Ratio: 4.5 Ratio (ref <5.0)
VLDL: 15 mg/dL (ref <30)

## 2016-04-11 LAB — COMPREHENSIVE METABOLIC PANEL
ALT: 5 U/L — ABNORMAL LOW (ref 6–29)
AST: 13 U/L (ref 10–30)
Albumin: 4 g/dL (ref 3.6–5.1)
Alkaline Phosphatase: 75 U/L (ref 33–115)
BILIRUBIN TOTAL: 0.3 mg/dL (ref 0.2–1.2)
BUN: 13 mg/dL (ref 7–25)
CALCIUM: 9.2 mg/dL (ref 8.6–10.2)
CHLORIDE: 108 mmol/L (ref 98–110)
CO2: 23 mmol/L (ref 20–31)
Creat: 1.07 mg/dL (ref 0.50–1.10)
Glucose, Bld: 84 mg/dL (ref 65–99)
Potassium: 4.3 mmol/L (ref 3.5–5.3)
Sodium: 138 mmol/L (ref 135–146)
TOTAL PROTEIN: 7 g/dL (ref 6.1–8.1)

## 2016-04-11 LAB — CBC
HCT: 36.1 % (ref 35.0–45.0)
Hemoglobin: 11.7 g/dL (ref 11.7–15.5)
MCH: 29.1 pg (ref 27.0–33.0)
MCHC: 32.4 g/dL (ref 32.0–36.0)
MCV: 89.8 fL (ref 80.0–100.0)
MPV: 10.2 fL (ref 7.5–12.5)
PLATELETS: 349 10*3/uL (ref 140–400)
RBC: 4.02 MIL/uL (ref 3.80–5.10)
RDW: 13.6 % (ref 11.0–15.0)
WBC: 6.5 10*3/uL (ref 3.8–10.8)

## 2016-04-11 LAB — HEMOGLOBIN A1C
Hgb A1c MFr Bld: 5.4 % (ref <5.7)
Mean Plasma Glucose: 108 mg/dL

## 2016-04-11 NOTE — Progress Notes (Signed)
HPI:                                                                Joanna Hayes is a 41 y.o. female who presents to [facility]: [place] today to establish care  Patient reports intermittent midline low back pain for the past year. Pain does not radiate. Relieved with OTC pain relievers. Denies paresthesias, urinary/stool retention or incontinence. No known injury or trauma. 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   No other concerns today.  Pap: due Oct 2017 Tetanus: states it is up to date, but unsure of date  Health Maintenance Health Maintenance  Topic Date Due  . HIV Screening  11/23/1992  . TETANUS/TDAP  11/23/1996  . PAP SMEAR  01/04/2016  . INFLUENZA VACCINE  Completed    Past Medical History:  Diagnosis Date  . Obesity    No past surgical history on file. Social History  Substance Use Topics  . Smoking status: Never Smoker  . Smokeless tobacco: Not on file  . Alcohol use No   family history includes Colon cancer in her paternal grandfather; Diabetes in her maternal grandfather; Hypertension in her father and mother.  Review of Systems  Constitutional: Negative.   HENT: Negative.   Eyes: Positive for blurred vision (wears corrective lenses).  Respiratory: Negative.   Cardiovascular: Negative.   Gastrointestinal: Negative.   Genitourinary: Negative.   Musculoskeletal: Positive for back pain.  Skin: Negative.   Neurological: Negative.   Psychiatric/Behavioral: Negative.      Medications: No current outpatient prescriptions on file.   No current facility-administered medications for this visit.    No Known Allergies     Objective:  BP 113/73   Pulse 91   Wt 200 lb (90.7 kg)   BMI 34.33 kg/m  Gen: well-groomed, cooperative, not ill-appearing, no distress HEENT: normal conjunctiva, TM's clear, oropharynx clear, moist mucus membranes, no thyromegaly or tenderness Lungs: Normal work of  breathing, clear to auscultation bilaterally Heart: Normal rate, regular rhythm, s1 and s2 distinct, no murmurs, clicks or rubs appreciated on this exam, no carotid bruit Abd: Soft. Obese. Nondistended, Nontender Neuro: alert and oriented x 3, EOM's intact, PERRLA, DTR's intact MSK: strength 5/5 and symmetric, normal gait Back: no spinous process tenderness, no step deformity, no palpable spasm, full active ROM Extremities: no peripheral edema Skin: warm and dry, no rashes or lesions on exposed skin Psych: normal affect, pleasant mood, normal speech and thought content   No results found for this or any previous visit (from the past 72 hour(s)). No results found.    Assessment and Plan: 41 y.o. female with   Encounter for preventative adult health care examination - Comprehensive metabolic panel - CBC - Hemoglobin A1c - Lipid panel - VITAMIN D 25 Hydroxy (Vit-D Deficiency, Fractures) - follow-up in 1 month for Pap smear  Obesity (BMI 30.0-34.9) Dyslipidemia - 10-year ASCVD <0.5% Lab Results  Component Value Date   CHOL 179 10/15/2013   HDL 49 10/15/2013   LDLCALC 120 (H) 10/15/2013   TRIG 52 10/15/2013   CHOLHDL 3.7 10/15/2013  - lifestyle modifications  Intermittent LBP - cont OTC NSAIDs as needed - patient given stretching and strength straining exercises - return  for new or worsening symptoms  Patient education and anticipatory guidance given Patient agrees with treatment plan Follow-up in 1 month for Pap or sooner as needed  Darlyne Russian PA-C

## 2016-04-11 NOTE — Patient Instructions (Signed)
Follow-up in the next month for Pap smear   Back Pain, Adult Back pain is very common in adults.The cause of back pain is rarely dangerous and the pain often gets better over time.The cause of your back pain may not be known. Some common causes of back pain include:  Strain of the muscles or ligaments supporting the spine.  Wear and tear (degeneration) of the spinal disks.  Arthritis.  Direct injury to the back. For many people, back pain may return. Since back pain is rarely dangerous, most people can learn to manage this condition on their own. Follow these instructions at home: Watch your back pain for any changes. The following actions may help to lessen any discomfort you are feeling:  Remain active. It is stressful on your back to sit or stand in one place for long periods of time. Do not sit, drive, or stand in one place for more than 30 minutes at a time. Take short walks on even surfaces as soon as you are able.Try to increase the length of time you walk each day.  Exercise regularly as directed by your health care provider. Exercise helps your back heal faster. It also helps avoid future injury by keeping your muscles strong and flexible.  Do not stay in bed.Resting more than 1-2 days can delay your recovery.  Pay attention to your body when you bend and lift. The most comfortable positions are those that put less stress on your recovering back. Always use proper lifting techniques, including:  Bending your knees.  Keeping the load close to your body.  Avoiding twisting.  Find a comfortable position to sleep. Use a firm mattress and lie on your side with your knees slightly bent. If you lie on your back, put a pillow under your knees.  Avoid feeling anxious or stressed.Stress increases muscle tension and can worsen back pain.It is important to recognize when you are anxious or stressed and learn ways to manage it, such as with exercise.  Take medicines only as  directed by your health care provider. Over-the-counter medicines to reduce pain and inflammation are often the most helpful.Your health care provider may prescribe muscle relaxant drugs.These medicines help dull your pain so you can more quickly return to your normal activities and healthy exercise.  Apply ice to the injured area:  Put ice in a plastic bag.  Place a towel between your skin and the bag.  Leave the ice on for 20 minutes, 2-3 times a day for the first 2-3 days. After that, ice and heat may be alternated to reduce pain and spasms.  Maintain a healthy weight. Excess weight puts extra stress on your back and makes it difficult to maintain good posture. Contact a health care provider if:  You have pain that is not relieved with rest or medicine.  You have increasing pain going down into the legs or buttocks.  You have pain that does not improve in one week.  You have night pain.  You lose weight.  You have a fever or chills. Get help right away if:  You develop new bowel or bladder control problems.  You have unusual weakness or numbness in your arms or legs.  You develop nausea or vomiting.  You develop abdominal pain.  You feel faint. This information is not intended to replace advice given to you by your health care provider. Make sure you discuss any questions you have with your health care provider. Document Released: 03/14/2005 Document Revised: 07/23/2015  Document Reviewed: 07/16/2013 Elsevier Interactive Patient Education  2017 [employer].

## 2016-04-12 ENCOUNTER — Encounter: Payer: Self-pay | Admitting: Physician Assistant

## 2016-04-12 DIAGNOSIS — E559 Vitamin D deficiency, unspecified: Secondary | ICD-10-CM | POA: Insufficient documentation

## 2016-04-12 LAB — VITAMIN D 25 HYDROXY (VIT D DEFICIENCY, FRACTURES): Vit D, 25-Hydroxy: 25 ng/mL — ABNORMAL LOW (ref 30–100)

## 2016-07-12 ENCOUNTER — Ambulatory Visit (INDEPENDENT_AMBULATORY_CARE_PROVIDER_SITE_OTHER): Payer: 59 | Admitting: Obstetrics & Gynecology

## 2016-07-12 ENCOUNTER — Encounter: Payer: Self-pay | Admitting: Obstetrics & Gynecology

## 2016-07-12 VITALS — BP 112/78 | HR 98 | Ht 64.0 in | Wt 199.0 lb

## 2016-07-12 DIAGNOSIS — Z01419 Encounter for gynecological examination (general) (routine) without abnormal findings: Secondary | ICD-10-CM

## 2016-07-12 NOTE — Progress Notes (Signed)
Subjective:    Joanna Hayes is a 41 y.o. M AAP2 (17 and 18yo kids)female who presents for an annual exam. The patient has no complaints today. The patient is sexually active. GYN screening history: last pap: was normal. The patient wears seatbelts: yes. The patient participates in regular exercise: no. Has the patient ever been transfused or tattooed?: yes. The patient reports that there is not domestic violence in her life.   Menstrual History: OB History    Gravida Para Term Preterm AB Living   2 2           SAB TAB Ectopic Multiple Live Births                  Menarche age: 30 Patient's last menstrual period was 07/02/2016.    The following portions of the patient's history were reviewed and updated as appropriate: allergies, current medications, past family history, past medical history, past social history, past surgical history and problem list.  Review of Systems Pertinent items are noted in HPI.   FH- + breast cancer in a pat and mat aunt, no gyn cancer, + colon cancer in PGF   Objective:    BP 112/78   Pulse 98   Ht 5\' 4"  (1.626 m)   Wt 199 lb (90.3 kg)   LMP 07/02/2016   BMI 34.16 kg/m   General Appearance:    Alert, cooperative, no distress, appears stated age  Head:    Normocephalic, without obvious abnormality, atraumatic  Eyes:    PERRL, conjunctiva/corneas clear, EOM's intact, fundi    benign, both eyes  Ears:    Normal TM's and external ear canals, both ears  Nose:   Nares normal, septum midline, mucosa normal, no drainage    or sinus tenderness  Throat:   Lips, mucosa, and tongue normal; teeth and gums normal  Neck:   Supple, symmetrical, trachea midline, no adenopathy;    thyroid:  no enlargement/tenderness/nodules; no carotid   bruit or JVD  Back:     Symmetric, no curvature, ROM normal, no CVA tenderness  Lungs:     Clear to auscultation bilaterally, respirations unlabored  Chest Wall:    No tenderness or deformity   Heart:    Regular rate and rhythm,  S1 and S2 normal, no murmur, rub   or gallop  Breast Exam:    No tenderness, masses, or nipple abnormality  Abdomen:     Soft, non-tender, bowel sounds active all four quadrants,    no masses, no organomegaly  Genitalia:    Normal female without lesion, discharge or tenderness, NSSA, NT, no adnexal masses     Extremities:   Extremities normal, atraumatic, no cyanosis or edema  Pulses:   2+ and symmetric all extremities  Skin:   Skin color, texture, turgor normal, no rashes or lesions  Lymph nodes:   Cervical, supraclavicular, and axillary nodes normal  Neurologic:   CNII-XII intact, normal strength, sensation and reflexes    throughout  .    Assessment:    Healthy female exam.    Plan:     Thin prep Pap smear. with cotesting

## 2016-07-14 LAB — CYTOLOGY - PAP
DIAGNOSIS: NEGATIVE
HPV (WINDOPATH): NOT DETECTED

## 2017-01-18 DIAGNOSIS — H52223 Regular astigmatism, bilateral: Secondary | ICD-10-CM | POA: Diagnosis not present

## 2017-09-25 ENCOUNTER — Encounter: Payer: Self-pay | Admitting: Family Medicine

## 2017-09-25 ENCOUNTER — Ambulatory Visit: Payer: Self-pay | Admitting: Family Medicine

## 2017-09-25 VITALS — BP 100/70 | HR 92 | Temp 99.0°F | Ht 64.0 in | Wt 210.2 lb

## 2017-09-25 DIAGNOSIS — Z0000 Encounter for general adult medical examination without abnormal findings: Secondary | ICD-10-CM

## 2017-09-25 NOTE — Progress Notes (Signed)
Joanna Hayes is a 42 y.o. female who presents today with concerns of need for employer annual physical exam. She reports she has PCP and her last dental exam in the last year. She denies any chronic or acute health needs that she would like treated today.  Review of Systems  Constitutional: Negative for chills, fever and malaise/fatigue.  HENT: Negative for congestion, ear discharge, ear pain, sinus pain and sore throat.   Eyes: Negative.   Respiratory: Negative for cough, sputum production and shortness of breath.   Cardiovascular: Negative.  Negative for chest pain.  Gastrointestinal: Negative for abdominal pain, diarrhea, nausea and vomiting.  Genitourinary: Negative for dysuria, frequency, hematuria and urgency.  Musculoskeletal: Negative for myalgias.  Skin: Negative.   Neurological: Negative for headaches.  Endo/Heme/Allergies: Negative.   Psychiatric/Behavioral: Negative.     O: Vitals:   09/25/17 1221  BP: 100/70  Pulse: 92  Temp: 99 F (37.2 C)  SpO2: 97%     Physical Exam  Constitutional: She is oriented to person, place, and time. Vital signs are normal. She appears well-developed and well-nourished. She is active.  Non-toxic appearance. She does not have a sickly appearance.  HENT:  Head: Normocephalic.  Right Ear: Hearing, tympanic membrane, external ear and ear canal normal.  Left Ear: Hearing, tympanic membrane, external ear and ear canal normal.  Nose: Nose normal.  Mouth/Throat: Uvula is midline and oropharynx is clear and moist.  Neck: Normal range of motion. Neck supple.  Cardiovascular: Normal rate, regular rhythm, normal heart sounds and normal pulses.  Pulmonary/Chest: Effort normal and breath sounds normal.  Abdominal: Soft. Bowel sounds are normal.  Musculoskeletal: Normal range of motion.  Lymphadenopathy:       Head (right side): No submental and no submandibular adenopathy present.       Head (left side): No submental and no submandibular  adenopathy present.    She has no cervical adenopathy.  Neurological: She is alert and oriented to person, place, and time.  Psychiatric: She has a normal mood and affect. Her speech is normal and behavior is normal. Cognition and memory are normal.  PHQ-9- negative  Vitals reviewed.    A: 1. Physical exam      P: Exam findings, diagnosis etiology and medication use and indications reviewed with patient. Follow- Up and discharge instructions provided. No emergent/urgent issues found on exam.  Patient verbalized understanding of information provided and agrees with plan of care (POC), all questions answered.  1. Physical exam WN:L

## 2017-09-25 NOTE — Patient Instructions (Signed)

## 2017-11-06 ENCOUNTER — Encounter: Payer: Self-pay | Admitting: Certified Nurse Midwife

## 2017-11-06 ENCOUNTER — Ambulatory Visit (INDEPENDENT_AMBULATORY_CARE_PROVIDER_SITE_OTHER): Payer: 59 | Admitting: Certified Nurse Midwife

## 2017-11-06 VITALS — BP 113/71 | HR 82 | Resp 16 | Ht 64.0 in | Wt 210.0 lb

## 2017-11-06 DIAGNOSIS — Z0000 Encounter for general adult medical examination without abnormal findings: Secondary | ICD-10-CM

## 2017-11-06 DIAGNOSIS — Z01419 Encounter for gynecological examination (general) (routine) without abnormal findings: Secondary | ICD-10-CM

## 2017-11-06 NOTE — Progress Notes (Signed)
Gynecology Annual Exam  History of Present Illness: Patient is a 42 y.o. G2P2 presents for annual exam. The patient has no complaints today. The patient is sexually active. She denies dyspareunia. The patient does perform self breast exams. There is no notable family history of breast or ovarian cancer in her family. The patient denies current symptoms of depression.  Past Medical History:  Past Medical History:  Diagnosis Date  . Obesity     Past Surgical History:  History reviewed. No pertinent surgical history.  Gynecologic History:  LMP: Patient's last menstrual period was 10/21/2017. Heavy Menses: no Intermenstrual Bleeding: no Postcoital Bleeding: no Dysmenorrhea: no Contraception: vasectomy Last Pap: completed on 07/12/16 ; result was: NIL and HR HPV negative  Mammogram: last completed on 2012, result was 1.5 cm fibroadenoma-left breast.  Obstetric History: G2P2  Family History:  Family History  Problem Relation Age of Onset  . Hypertension Mother   . Hypertension Father   . Diabetes Maternal Grandfather   . Colon cancer Paternal Grandfather     Social History:  Social History   Socioeconomic History  . Marital status: Married    Spouse name: Not on file  . Number of children: Not on file  . Years of education: Not on file  . Highest education level: Not on file  Occupational History  . Not on file  Social Needs  . Financial resource strain: Not on file  . Food insecurity:    Worry: Not on file    Inability: Not on file  . Transportation needs:    Medical: Not on file    Non-medical: Not on file  Tobacco Use  . Smoking status: Never Smoker  . Smokeless tobacco: Never Used  Substance and Sexual Activity  . Alcohol use: No  . Drug use: No  . Sexual activity: Yes    Partners: Male    Birth control/protection: None    Comment: Husband Vasectomy  Lifestyle  . Physical activity:    Days per week: Not on file    Minutes per session: Not on file  .  Stress: Not on file  Relationships  . Social connections:    Talks on phone: Not on file    Gets together: Not on file    Attends religious service: Not on file    Active member of club or organization: Not on file    Attends meetings of clubs or organizations: Not on file    Relationship status: Not on file  . Intimate partner violence:    Fear of current or ex partner: Not on file    Emotionally abused: Not on file    Physically abused: Not on file    Forced sexual activity: Not on file  Other Topics Concern  . Not on file  Social History Narrative  . Not on file    Allergies:  No Known Allergies  Medications: Prior to Admission medications   Medication Sig Start Date End Date Taking? Authorizing Provider  cetirizine (ZYRTEC) 10 MG chewable tablet Chew 10 mg by mouth daily.   Yes [provider]  cholecalciferol (VITAMIN D) 1000 units tablet Take 1,000 Units by mouth daily.    [provider]    Review of Systems: negative except noted in HPI  Physical Exam Vitals: Blood pressure 113/71, pulse 82, resp. rate 16, height 5\' 4"  (1.626 m), weight 95.3 kg, last menstrual period 10/21/2017. General: NAD HEENT: normocephalic, atraumatic Thyroid: no enlargement, no palpable nodules Pulmonary: Normal rate and  effort, CTAB Cardiovascular: RRR Breast: Breast symmetrical, no tenderness, no palpable nodules or masses, no skin or nipple retraction present, no nipple discharge. No axillary or supraclavicular lymphadenopathy. Abdomen: soft, non-tender, non-distended. No hepatomegaly, splenomegaly or masses palpable. No evidence of hernia  Genitourinary: deferred Extremities: no edema, erythema, or tenderness Neurologic: Grossly intact Psychiatric: mood appropriate, affect full  Female chaperone present for breast portion of the physical exam  Assessment:  1. Well woman exam     Plan: Mammogram 1 month Follow up with GYN in 1 year or prn Follow up with PCP  soon for annual visit  Julianne Handler, [place] 11/06/2017 10:07 AM

## 2017-11-08 ENCOUNTER — Ambulatory Visit: Payer: 59

## 2017-12-11 ENCOUNTER — Other Ambulatory Visit: Payer: Self-pay

## 2017-12-11 DIAGNOSIS — D242 Benign neoplasm of left breast: Secondary | ICD-10-CM

## 2017-12-15 ENCOUNTER — Ambulatory Visit
Admission: RE | Admit: 2017-12-15 | Discharge: 2017-12-15 | Disposition: A | Discharge status: Home / Self Care | Payer: 59 | Source: Ambulatory Visit | Attending: Certified Nurse Midwife | Admitting: Certified Nurse Midwife

## 2017-12-15 DIAGNOSIS — D242 Benign neoplasm of left breast: Secondary | ICD-10-CM

## 2017-12-15 DIAGNOSIS — N6489 Other specified disorders of breast: Secondary | ICD-10-CM | POA: Diagnosis not present

## 2017-12-15 DIAGNOSIS — R928 Other abnormal and inconclusive findings on diagnostic imaging of breast: Secondary | ICD-10-CM | POA: Diagnosis not present

## 2018-02-10 DIAGNOSIS — H52223 Regular astigmatism, bilateral: Secondary | ICD-10-CM | POA: Diagnosis not present

## 2018-04-23 ENCOUNTER — Ambulatory Visit (INDEPENDENT_AMBULATORY_CARE_PROVIDER_SITE_OTHER): Payer: 59 | Admitting: Physician Assistant

## 2018-04-23 ENCOUNTER — Encounter: Payer: Self-pay | Admitting: Physician Assistant

## 2018-04-23 VITALS — BP 120/81 | HR 80 | Wt 201.0 lb

## 2018-04-23 DIAGNOSIS — Z8349 Family history of other endocrine, nutritional and metabolic diseases: Secondary | ICD-10-CM | POA: Diagnosis not present

## 2018-04-23 DIAGNOSIS — E785 Hyperlipidemia, unspecified: Secondary | ICD-10-CM | POA: Diagnosis not present

## 2018-04-23 DIAGNOSIS — Z0000 Encounter for general adult medical examination without abnormal findings: Secondary | ICD-10-CM

## 2018-04-23 DIAGNOSIS — Z6834 Body mass index (BMI) 34.0-34.9, adult: Secondary | ICD-10-CM | POA: Diagnosis not present

## 2018-04-23 DIAGNOSIS — E559 Vitamin D deficiency, unspecified: Secondary | ICD-10-CM | POA: Diagnosis not present

## 2018-04-23 DIAGNOSIS — E6609 Other obesity due to excess calories: Secondary | ICD-10-CM

## 2018-04-23 DIAGNOSIS — Z30011 Encounter for initial prescription of contraceptive pills: Secondary | ICD-10-CM

## 2018-04-23 DIAGNOSIS — Z01419 Encounter for gynecological examination (general) (routine) without abnormal findings: Secondary | ICD-10-CM | POA: Insufficient documentation

## 2018-04-23 MED ORDER — DESOGESTREL-ETHINYL ESTRADIOL 0.15-0.02/0.01 MG (21/5) PO TABS
1.0000 | ORAL_TABLET | Freq: Every day | ORAL | 4 refills | Status: DC
Start: 2018-04-23 — End: 2019-11-26

## 2018-04-23 MED FILL — PIMTREA 28 DAY TABLET: 0.15-0.02/0 | 84 days supply | Qty: 84 | Fill #0

## 2018-04-23 NOTE — Progress Notes (Signed)
HPI:                                                                Joanna Hayes is a 43 y.o. female who presents to [facility]: [place] today for annual physical exam  Current concerns: none  Exercises 3 days per week for at least 30 minutes, mixture of cardio and strength training. She has lost 10 pounds in the last 5 months.  She would like to start birth control pill for contraception. Sexually active, 1 female partner. Denies hx of STI or known exposure. Currently uses condoms.  Depression screen PHQ 2/9 04/23/2018  Decreased Interest 0  Down, Depressed, Hopeless 0  PHQ - 2 Score 0     Past Medical History:  Diagnosis Date  . Obesity    Past Surgical History:  Procedure Laterality Date  . NO PAST SURGERIES     Social History   Tobacco Use  . Smoking status: Never Smoker  . Smokeless tobacco: Never Used  Substance Use Topics  . Alcohol use: No   family history includes Breast cancer in her maternal uncle and paternal aunt; Colon cancer in her paternal grandfather; Diabetes in her maternal grandfather; Hypertension in her father and mother; Prostate cancer in her father; Thyroid disease in her brother and sister.    ROS: negative except as noted in the HPI  Medications: Current Outpatient Medications  Medication Sig Dispense Refill  . Ascorbic Acid (VITAMIN C PO) Take by mouth.    . cetirizine (ZYRTEC) 10 MG chewable tablet Chew 10 mg by mouth daily.    . cholecalciferol (VITAMIN D) 1000 units tablet Take 1,000 Units by mouth daily.    Marland Kitchen VITAMIN E PO Take by mouth.     No current facility-administered medications for this visit.    No Known Allergies     Objective:  BP 120/81   Pulse 80   Wt 201 lb (91.2 kg)   LMP 03/27/2018   BMI 34.50 kg/m  General Appearance:  Alert, cooperative, no distress, appropriate for age, obese female                            Head:  Normocephalic, without obvious abnormality                      Eyes:  PERRL, EOM's intact, conjunctiva and cornea clear, wearing glasses                             Ears:  TM pearly gray color and semitransparent, external ear canals normal, both ears                            Nose:  Nares symmetrical, mucosa pink                          Throat:  Lips, tongue, and mucosa are moist, pink, and intact; oropharynx clear, uvula midline; good dentition  Neck:  Supple; symmetrical, trachea midline, no adenopathy; thyroid: no enlargement, symmetric, no tenderness/mass/nodules                             Back:  Symmetrical, no curvature, ROM normal               Chest/Breast:  deferred                           Lungs:  Clear to auscultation bilaterally, respirations unlabored                             Heart:  normal rate & regular rhythm, S1 and S2 normal, no murmurs, rubs, or gallops                     Abdomen:  Soft, non-tender, no mass or organomegaly              Genitourinary:  deferred         Musculoskeletal:  Tone and strength strong and symmetrical, all extremities; no joint pain or edema, normal gait and station                                   Lymphatic:  No adenopathy             Skin/Hair/Nails:  Skin warm, dry and intact, no rashes or abnormal dyspigmentation on limited exam                   Neurologic:  Alert and oriented x3, no cranial nerve deficits,  sensation grossly intact, normal gait and station, no tremor Psych: well-groomed, cooperative, good eye contact, euthymic mood, affect mood-congruent, speech is articulate, and thought processes clear and goal-directed  Wt Readings from Last 3 Encounters:  04/23/18 201 lb (91.2 kg)  11/06/17 210 lb (95.3 kg)  09/25/17 210 lb 3.2 oz (95.3 kg)     No results found for this or any previous visit (from the past 72 hour(s)). No results found.    Assessment and Plan: 43 y.o. female with   .Joanna Hayes was seen today for annual exam.  Diagnoses and  all orders for this visit:  Encounter for annual physical exam -     CBC -     COMPLETE METABOLIC PANEL WITH GFR -     Lipid Panel w/reflex Direct LDL -     VITAMIN D 25 Hydroxy (Vit-D Deficiency, Fractures)  Dyslipidemia (high LDL; low HDL) -     Lipid Panel w/reflex Direct LDL  Vitamin D insufficiency -     VITAMIN D 25 Hydroxy (Vit-D Deficiency, Fractures)  Family history of thyroid disease -     TSH + free T4  Class 1 obesity due to excess calories with body mass index (BMI) of 34.0 to 34.9 in adult, unspecified whether serious comorbidity present  Encounter for initial prescription of contraceptive pills -     desogestrel-ethinyl estradiol (KARIVA,AZURETTE,MIRCETTE) 0.15-0.02/0.01 MG (21/5) tablet; Take 1 tablet by mouth daily.  Other orders -     Cancel: MM 3D SCREEN BREAST BILATERAL; Future   - Personally reviewed PMH, PSH, PFH, medications, allergies, HM - Age-appropriate cancer screening: Yearly mammogram UTD 12/15/17; Pap UTD 07/12/16, NILM, HPV neg - Influenza declined - Tdap UTD  per patient - PHQ2 negative - Routine fasting labs pending - Applauded for weight loss efforts. Encouraged to continue decreasing caloric intake and increasing aerobic exercise  Initial prescription of OCP LMP 03/27/18, uses condoms consistently, deferred urine hCG Discussed contraceptive options including LARCs. Patient opted for COC Recheck BP in 1 month   Patient education and anticipatory guidance given Patient agrees with treatment plan Follow-up in 1 month for nurse BP check or sooner as needed if symptoms worsen or fail to improve  Darlyne Russian PA-C

## 2018-04-23 NOTE — Patient Instructions (Signed)
Preventive Care 40-64 Years, Female Preventive care refers to lifestyle choices and visits with your health care provider that can promote health and wellness. What does preventive care include?   A yearly physical exam. This is also called an annual well check.  Dental exams once or twice a year.  Routine eye exams. Ask your health care provider how often you should have your eyes checked.  Personal lifestyle choices, including: ? Daily care of your teeth and gums. ? Regular physical activity. ? Eating a healthy diet. ? Avoiding tobacco and drug use. ? Limiting alcohol use. ? Practicing safe sex. ? Taking low-dose aspirin daily starting at age 50. ? Taking vitamin and mineral supplements as recommended by your health care provider. What happens during an annual well check? The services and screenings done by your health care provider during your annual well check will depend on your age, overall health, lifestyle risk factors, and family history of disease. Counseling Your health care provider may ask you questions about your:  Alcohol use.  Tobacco use.  Drug use.  Emotional well-being.  Home and relationship well-being.  Sexual activity.  Eating habits.  Work and work environment.  Method of birth control.  Menstrual cycle.  Pregnancy history. Screening You may have the following tests or measurements:  Height, weight, and BMI.  Blood pressure.  Lipid and cholesterol levels. These may be checked every 5 years, or more frequently if you are over 50 years old.  Skin check.  Lung cancer screening. You may have this screening every year starting at age 55 if you have a 30-pack-year history of smoking and currently smoke or have quit within the past 15 years.  Colorectal cancer screening. All adults should have this screening starting at age 50 and continuing until age 75. Your health care provider may recommend screening at age 45. You will have tests every  1-10 years, depending on your results and the type of screening test. People at increased risk should start screening at an earlier age. Screening tests may include: ? Guaiac-based fecal occult blood testing. ? Fecal immunochemical test (FIT). ? Stool DNA test. ? Virtual colonoscopy. ? Sigmoidoscopy. During this test, a flexible tube with a tiny camera (sigmoidoscope) is used to examine your rectum and lower colon. The sigmoidoscope is inserted through your anus into your rectum and lower colon. ? Colonoscopy. During this test, a long, thin, flexible tube with a tiny camera (colonoscope) is used to examine your entire colon and rectum.  Hepatitis C blood test.  Hepatitis B blood test.  Sexually transmitted disease (STD) testing.  Diabetes screening. This is done by checking your blood sugar (glucose) after you have not eaten for a while (fasting). You may have this done every 1-3 years.  Mammogram. This may be done every 1-2 years. Talk to your health care provider about when you should start having regular mammograms. This may depend on whether you have a family history of breast cancer.  BRCA-related cancer screening. This may be done if you have a family history of breast, ovarian, tubal, or peritoneal cancers.  Pelvic exam and Pap test. This may be done every 3 years starting at age 21. Starting at age 30, this may be done every 5 years if you have a Pap test in combination with an HPV test.  Bone density scan. This is done to screen for osteoporosis. You may have this scan if you are at high risk for osteoporosis. Discuss your test results, treatment options,   and if necessary, the need for more tests with your health care provider. Vaccines Your health care provider may recommend certain vaccines, such as:  Influenza vaccine. This is recommended every year.  Tetanus, diphtheria, and acellular pertussis (Tdap, Td) vaccine. You may need a Td booster every 10 years.  Varicella  vaccine. You may need this if you have not been vaccinated.  Zoster vaccine. You may need this after age 38.  Measles, mumps, and rubella (MMR) vaccine. You may need at least one dose of MMR if you were born in 1957 or later. You may also need a second dose.  Pneumococcal 13-valent conjugate (PCV13) vaccine. You may need this if you have certain conditions and were not previously vaccinated.  Pneumococcal polysaccharide (PPSV23) vaccine. You may need one or two doses if you smoke cigarettes or if you have certain conditions.  Meningococcal vaccine. You may need this if you have certain conditions.  Hepatitis A vaccine. You may need this if you have certain conditions or if you travel or work in places where you may be exposed to hepatitis A.  Hepatitis B vaccine. You may need this if you have certain conditions or if you travel or work in places where you may be exposed to hepatitis B.  Haemophilus influenzae type b (Hib) vaccine. You may need this if you have certain conditions. Talk to your health care provider about which screenings and vaccines you need and how often you need them. This information is not intended to replace advice given to you by your health care provider. Make sure you discuss any questions you have with your health care provider. Document Released: 04/10/2015 Document Revised: 05/04/2017 Document Reviewed: 01/13/2015 Elsevier Interactive Patient Education  2019 [employer].

## 2018-04-24 DIAGNOSIS — E785 Hyperlipidemia, unspecified: Secondary | ICD-10-CM | POA: Diagnosis not present

## 2018-04-24 DIAGNOSIS — Z8349 Family history of other endocrine, nutritional and metabolic diseases: Secondary | ICD-10-CM | POA: Diagnosis not present

## 2018-04-24 DIAGNOSIS — E559 Vitamin D deficiency, unspecified: Secondary | ICD-10-CM | POA: Diagnosis not present

## 2018-04-24 DIAGNOSIS — Z0000 Encounter for general adult medical examination without abnormal findings: Secondary | ICD-10-CM | POA: Diagnosis not present

## 2018-04-24 LAB — TSH+FREE T4: TSH W/REFLEX TO FT4: 0.61 mIU/L

## 2018-04-25 LAB — COMPLETE METABOLIC PANEL WITH GFR
AG RATIO: 1.3 (calc) (ref 1.0–2.5)
ALKALINE PHOSPHATASE (APISO): 79 U/L (ref 33–115)
ALT: 9 U/L (ref 6–29)
AST: 13 U/L (ref 10–30)
Albumin: 3.6 g/dL (ref 3.6–5.1)
BUN: 15 mg/dL (ref 7–25)
CO2: 25 mmol/L (ref 20–32)
Calcium: 8.9 mg/dL (ref 8.6–10.2)
Chloride: 109 mmol/L (ref 98–110)
Creat: 0.9 mg/dL (ref 0.50–1.10)
GFR, Est African American: 93 mL/min/{1.73_m2} (ref >=60)
GFR, Est Non African American: 80 mL/min/{1.73_m2} (ref >=60)
Globulin: 2.8 g/dL (calc) (ref 1.9–3.7)
Glucose, Bld: 105 mg/dL — ABNORMAL HIGH (ref 65–99)
POTASSIUM: 3.6 mmol/L (ref 3.5–5.3)
Sodium: 140 mmol/L (ref 135–146)
Total Bilirubin: 0.3 mg/dL (ref 0.2–1.2)
Total Protein: 6.4 g/dL (ref 6.1–8.1)

## 2018-04-25 LAB — CBC
HEMATOCRIT: 34.1 % — AB (ref 35.0–45.0)
Hemoglobin: 11.5 g/dL — ABNORMAL LOW (ref 11.7–15.5)
MCH: 30 pg (ref 27.0–33.0)
MCHC: 33.7 g/dL (ref 32.0–36.0)
MCV: 89 fL (ref 80.0–100.0)
MPV: 11.2 fL (ref 7.5–12.5)
PLATELETS: 294 10*3/uL (ref 140–400)
RBC: 3.83 10*6/uL (ref 3.80–5.10)
RDW: 13.3 % (ref 11.0–15.0)
WBC: 4.5 10*3/uL (ref 3.8–10.8)

## 2018-04-25 LAB — LIPID PANEL W/REFLEX DIRECT LDL
CHOLESTEROL: 164 mg/dL (ref <200)
HDL: 38 mg/dL — AB (ref >50)
LDL Cholesterol (Calc): 112 mg/dL (calc) — ABNORMAL HIGH
Non-HDL Cholesterol (Calc): 126 mg/dL (calc) (ref <130)
Total CHOL/HDL Ratio: 4.3 (calc) (ref <5.0)
Triglycerides: 56 mg/dL (ref <150)

## 2018-04-25 LAB — VITAMIN D 25 HYDROXY (VIT D DEFICIENCY, FRACTURES): VIT D 25 HYDROXY: 32 ng/mL (ref 30–100)

## 2018-04-27 ENCOUNTER — Encounter: Payer: Self-pay | Admitting: Physician Assistant

## 2018-04-27 DIAGNOSIS — D500 Iron deficiency anemia secondary to blood loss (chronic): Secondary | ICD-10-CM

## 2018-04-27 HISTORY — DX: Iron deficiency anemia secondary to blood loss (chronic): D50.0

## 2018-04-30 ENCOUNTER — Encounter: Payer: Self-pay | Admitting: Physician Assistant

## 2018-05-07 ENCOUNTER — Encounter: Payer: Self-pay | Admitting: Physician Assistant

## 2018-05-07 ENCOUNTER — Ambulatory Visit (INDEPENDENT_AMBULATORY_CARE_PROVIDER_SITE_OTHER): Payer: 59

## 2018-05-07 ENCOUNTER — Ambulatory Visit (INDEPENDENT_AMBULATORY_CARE_PROVIDER_SITE_OTHER): Payer: 59 | Admitting: Physician Assistant

## 2018-05-07 VITALS — BP 123/77 | HR 110 | Temp 100.3°F | Wt 197.0 lb

## 2018-05-07 DIAGNOSIS — J989 Respiratory disorder, unspecified: Secondary | ICD-10-CM | POA: Diagnosis not present

## 2018-05-07 DIAGNOSIS — R509 Fever, unspecified: Secondary | ICD-10-CM

## 2018-05-07 DIAGNOSIS — R05 Cough: Secondary | ICD-10-CM

## 2018-05-07 MED ORDER — DOXYCYCLINE HYCLATE 100 MG PO TABS: 100.0000 mg | ORAL_TABLET | Freq: Two times a day (BID) | ORAL | 0 refills | Status: AC

## 2018-05-07 MED ORDER — HYDROCOD POLST-CPM POLST ER 10-8 MG/5ML PO SUER: 5.0000 mL | Freq: Two times a day (BID) | ORAL | 0 refills | Status: AC | PRN

## 2018-05-07 NOTE — Progress Notes (Signed)
HPI:                                                                Nidhi Jacome is a 43 y.o. female who presents to [place]: [place] today for cough  Cough  This is a new problem. The current episode started 1 to 4 weeks ago. The problem has been gradually worsening. The cough is non-productive. Associated symptoms include chills, a fever and nasal congestion. Pertinent negatives include no chest pain, ear pain, hemoptysis, rash, shortness of breath or wheezing. Nothing aggravates the symptoms. She has tried OTC cough suppressant and rest for the symptoms.     Past Medical History:  Diagnosis Date  . Normocytic anemia due to blood loss 04/27/2018  . Obesity    Past Surgical History:  Procedure Laterality Date  . NO PAST SURGERIES     Social History   Tobacco Use  . Smoking status: Never Smoker  . Smokeless tobacco: Never Used  Substance Use Topics  . Alcohol use: No   family history includes Breast cancer in her maternal uncle and paternal aunt; Colon cancer in her paternal grandfather; Diabetes in her maternal grandfather; Hypertension in her father and mother; Prostate cancer in her father; Thyroid disease in her brother and sister.    ROS: Review of Systems  Constitutional: Positive for chills, fever and malaise/fatigue.  HENT: Positive for congestion and sinus pain. Negative for ear pain.   Respiratory: Positive for cough. Negative for hemoptysis, sputum production, shortness of breath and wheezing.   Cardiovascular: Negative for chest pain.  Gastrointestinal: Negative for abdominal pain, nausea and vomiting.  Skin: Negative for rash.  All other systems reviewed and are negative.    Medications: Current Outpatient Medications  Medication Sig Dispense Refill  . Ascorbic Acid (VITAMIN C PO) Take by mouth.    . cetirizine (ZYRTEC) 10 MG chewable tablet Chew 10 mg by mouth daily.    . cholecalciferol (VITAMIN D) 1000 units  tablet Take 1,000 Units by mouth daily.    Marland Kitchen desogestrel-ethinyl estradiol (KARIVA,AZURETTE,MIRCETTE) 0.15-0.02/0.01 MG (21/5) tablet Take 1 tablet by mouth daily. 3 Package 4  . VITAMIN E PO Take by mouth.     No current facility-administered medications for this visit.    No Known Allergies     Objective:  BP 123/77   Pulse (!) 110   Temp 100.3 F (37.9 C) (Oral)   Wt 197 lb (89.4 kg)   SpO2 95%   BMI 33.81 kg/m  Gen:  alert, ill-appearing, not toxic-appearing, no distress, appropriate for age 11: head normocephalic without obvious abnormality, conjunctiva and cornea clear, wearing glasses, TM's pearly gray and semi-transparent, nasal mucosa edematous, oropharynx clear, neck supple, no cervical adenopathy, trachea midline Pulm: Normal work of breathing, normal phonation, clear to auscultation bilaterally, no wheezes, rales or rhonchi CV: mildly tachycardic rate, regular rhythm, s1 and s2 distinct, no murmurs, clicks or rubs  Neuro: alert and oriented x 3, no tremor MSK: extremities atraumatic, normal gait and station Skin: intact, no rashes on exposed skin, no jaundice, no cyanosis    No results found for this or any previous visit (from the past 72 hour(s)). No results found.    Assessment and Plan: 43 y.o. female with   .  Anika was seen today for cough.  Diagnoses and all orders for this visit:  Febrile respiratory illness -     DG Chest 2 View -     doxycycline (VIBRA-TABS) 100 MG tablet; Take 1 tablet (100 mg total) by mouth 2 (two) times daily for 7 days. -     chlorpheniramine-HYDROcodone ([place]) 10-8 MG/5ML SUER; Take 5 mLs by mouth every 12 (twelve) hours as needed for up to 5 days for cough.   Febrile at 100.3, tachycardic at 110, pulse ox reduced at 95% on RA at rest, no adventitious lung sounds, no tachypnea CXR pending to assess for infiltrate Will cover for CAP and bacterial sinusitis with Doxycycline. If CXR shows infiltrate, will have her  follow-up in the office in 1 week to ensure resolution of symptoms and obtain repeat CXR in 6 weeks Tussionex QHS prn for nocturnal cough   Patient education and anticipatory guidance given Patient agrees with treatment plan Follow-up as needed if symptoms worsen or fail to improve  Darlyne Russian PA-C

## 2018-05-07 NOTE — Patient Instructions (Signed)
Community-Acquired Pneumonia, Adult °Pneumonia is an infection of the lungs. There are different types of pneumonia. One type can develop while a person is in a hospital. A different type, called community-acquired pneumonia, develops in people who are not, or have not recently been, in the hospital or other health care facility. °What are the causes? ° °Pneumonia may be caused by bacteria, viruses, or funguses. Community-acquired pneumonia is often caused by Streptococcus pneumonia bacteria. These bacteria are often passed from one person to another by breathing in droplets from the cough or sneeze of an infected person. °What increases the risk? °The condition is more likely to develop in: °· People who have chronic diseases, such as chronic obstructive pulmonary disease (COPD), asthma, congestive heart failure, cystic fibrosis, diabetes, or kidney disease. °· People who have early-stage or late-stage HIV. °· People who have sickle cell disease. °· People who have had their spleen removed (splenectomy). °· People who have poor dental hygiene. °· People who have medical conditions that increase the risk of breathing in (aspirating) secretions their own mouth and nose. °· People who have a weakened immune system (immunocompromised). °· People who smoke. °· People who travel to areas where pneumonia-causing germs commonly exist. °· People who are around animal habitats or animals that have pneumonia-causing germs, including birds, bats, rabbits, cats, and farm animals. °What are the signs or symptoms? °Symptoms of this condition include: °· A dry cough. °· A wet (productive) cough. °· Fever. °· Sweating. °· Chest pain, especially when breathing deeply or coughing. °· Rapid breathing or difficulty breathing. °· Shortness of breath. °· Shaking chills. °· Fatigue. °· Muscle aches. °How is this diagnosed? °Your health care provider will take a medical history and perform a physical exam. You may also have other tests,  including: °· Imaging studies of your chest, including X-rays. °· Tests to check your blood oxygen level and other blood gases. °· Other tests on blood, mucus (sputum), fluid around your lungs (pleural fluid), and urine. °If your pneumonia is severe, other tests may be done to identify the specific cause of your illness. °How is this treated? °The type of treatment that you receive depends on many factors, such as the cause of your pneumonia, the medicines you take, and other medical conditions that you have. For most adults, treatment and recovery from pneumonia may occur at home. In some cases, treatment must happen in a hospital. Treatment may include: °· Antibiotic medicines, if the pneumonia was caused by bacteria. °· Antiviral medicines, if the pneumonia was caused by a virus. °· Medicines that are given by mouth or through an IV tube. °· Oxygen. °· Respiratory therapy. °Although rare, treating severe pneumonia may include: °· Mechanical ventilation. This is done if you are not breathing well on your own and you cannot maintain a safe blood oxygen level. °· Thoracentesis. This procedure removes fluid around one lung or both lungs to help you breathe better. °Follow these instructions at home: ° °· Take over-the-counter and prescription medicines only as told by your health care provider. °? Only take cough medicine if you are losing sleep. Understand that cough medicine can prevent your body’s natural ability to remove mucus from your lungs. °? If you were prescribed an antibiotic medicine, take it as told by your health care provider. Do not stop taking the antibiotic even if you start to feel better. °· Sleep in a semi-upright position at night. Try sleeping in a reclining chair, or place a few pillows under your head. °· Do not use tobacco products, including cigarettes, chewing tobacco, and e-cigarettes.   If you need help quitting, ask your health care provider. °· Drink enough water to keep your urine  clear or pale yellow. This will help to thin out mucus secretions in your lungs. °How is this prevented? °There are ways that you can decrease your risk of developing community-acquired pneumonia. Consider getting a pneumococcal vaccine if: °· You are older than 41 years of age. °· You are older than 41 years of age and are undergoing cancer treatment, have chronic lung disease, or have other medical conditions that affect your immune system. Ask your health care provider if this applies to you. °There are different types and schedules of pneumococcal vaccines. Ask your health care provider which vaccination option is best for you. °You may also prevent community-acquired pneumonia if you take these actions: °· Get an influenza vaccine every year. Ask your health care provider which type of influenza vaccine is best for you. °· Go to the dentist on a regular basis. °· Wash your hands often. Use hand sanitizer if soap and water are not available. °Contact a health care provider if: °· You have a fever. °· You are losing sleep because you cannot control your cough with cough medicine. °Get help right away if: °· You have worsening shortness of breath. °· You have increased chest pain. °· Your sickness becomes worse, especially if you are an older adult or have a weakened immune system. °· You cough up blood. °This information is not intended to replace advice given to you by your health care provider. Make sure you discuss any questions you have with your health care provider. °Document Released: 03/14/2005 Document Revised: 12/01/2016 Document Reviewed: 07/09/2014 °Elsevier Interactive Patient Education © 2019 [employer] ° °

## 2018-05-21 ENCOUNTER — Ambulatory Visit (INDEPENDENT_AMBULATORY_CARE_PROVIDER_SITE_OTHER): Payer: 59 | Admitting: Physician Assistant

## 2018-05-21 VITALS — BP 127/76 | HR 84

## 2018-05-21 DIAGNOSIS — Z3041 Encounter for surveillance of contraceptive pills: Secondary | ICD-10-CM | POA: Diagnosis not present

## 2018-05-21 NOTE — Progress Notes (Signed)
Pt was started on Kariva at last OV. She was advised to return for BP check after starting. Reports tolerating the new Rx well. Some mild nausea for the first week, but this subsided.   Vitals:   05/21/18 0910  BP: 127/76  Pulse: 84    BP at goal today. No further questions or concerns.

## 2018-07-16 MED FILL — PIMTREA 28 DAY TABLET: 0.15-0.02/0 | 84 days supply | Qty: 84 | Fill #1

## 2018-10-05 MED FILL — PIMTREA 28 DAY TABLET: 0.15-0.02/0 | 84 days supply | Qty: 84 | Fill #2

## 2018-10-24 ENCOUNTER — Other Ambulatory Visit: Payer: Self-pay

## 2018-10-24 ENCOUNTER — Ambulatory Visit (INDEPENDENT_AMBULATORY_CARE_PROVIDER_SITE_OTHER): Payer: 59 | Admitting: Obstetrics and Gynecology

## 2018-10-24 ENCOUNTER — Encounter: Payer: Self-pay | Admitting: Obstetrics and Gynecology

## 2018-10-24 VITALS — BP 111/71 | HR 86 | Resp 16 | Ht 64.0 in | Wt 201.0 lb

## 2018-10-24 DIAGNOSIS — Z30430 Encounter for insertion of intrauterine contraceptive device: Secondary | ICD-10-CM

## 2018-10-24 DIAGNOSIS — Z3202 Encounter for pregnancy test, result negative: Secondary | ICD-10-CM

## 2018-10-24 LAB — POCT URINE PREGNANCY: Preg Test, Ur: NEGATIVE

## 2018-10-24 MED ORDER — LEVONORGESTREL 19.5 MCG/DAY IU IUD
INTRAUTERINE_SYSTEM | Freq: Once | INTRAUTERINE | Status: AC
  Administered 2018-10-24: 11:00:00 via INTRAUTERINE

## 2018-10-24 NOTE — Patient Instructions (Signed)

## 2018-10-24 NOTE — Progress Notes (Signed)
   [facility] NOTE  Joanna Hayes is a 43 y.o. G2P2 here for [place] IUD insertion. No GYN concerns.  Last pap smear was on 2019 and was normal.  IUD Insertion Procedure Note Patient identified, informed consent performed, consent signed.   Discussed risks of irregular bleeding, cramping, infection, malpositioning or misplacement of the IUD outside the uterus which may require further procedure such as laparoscopy. Time out was performed.  Urine pregnancy test negative.  Speculum placed in the vagina.  Cervix visualized.  Cleaned with Betadine x 2.  Grasped anteriorly with a single tooth tenaculum.  Uterus sounded to 7-8 cm.  Liletta IUD placed per manufacturer's recommendations.  Strings trimmed to 3 cm. Tenaculum was removed, good hemostasis noted.  Patient tolerated procedure well.   Patient was given post-procedure instructions.  She was advised to have backup contraception for one week.  Patient was also asked to check IUD strings periodically and follow up in 4 weeks for IUD check.  Pain 1/10 post insertion Scant bleeding  Joanna Hayes, Artist Pais, NP 10/24/2018 11:14 AM

## 2018-11-21 ENCOUNTER — Ambulatory Visit (INDEPENDENT_AMBULATORY_CARE_PROVIDER_SITE_OTHER): Payer: 59 | Admitting: Obstetrics and Gynecology

## 2018-11-21 ENCOUNTER — Other Ambulatory Visit: Payer: Self-pay | Admitting: Certified Nurse Midwife

## 2018-11-21 ENCOUNTER — Other Ambulatory Visit: Payer: Self-pay

## 2018-11-21 VITALS — BP 105/67 | HR 87 | Ht 64.0 in | Wt 200.0 lb

## 2018-11-21 DIAGNOSIS — Z1231 Encounter for screening mammogram for malignant neoplasm of breast: Secondary | ICD-10-CM

## 2018-11-21 DIAGNOSIS — Z30431 Encounter for routine checking of intrauterine contraceptive device: Secondary | ICD-10-CM | POA: Insufficient documentation

## 2018-11-21 DIAGNOSIS — Z01419 Encounter for gynecological examination (general) (routine) without abnormal findings: Secondary | ICD-10-CM | POA: Diagnosis not present

## 2018-11-21 NOTE — Progress Notes (Signed)
Last pap- 07/12/16- negative Mammogram scheduled for Sept Pt c/o occasional spotting with IUD

## 2018-11-21 NOTE — Progress Notes (Signed)
GYNECOLOGY ANNUAL PREVENTATIVE CARE ENCOUNTER NOTE  History:     Joanna Hayes is a 43 y.o. G2P2 female here for a routine annual gynecologic exam.  Current complaints: spotting after IUD placed. No pain.    Denies abnormal vaginal bleeding, discharge, pelvic pain, problems with intercourse or other gynecologic concerns.    Gynecologic History No LMP recorded.- LMP 10/30/2018 Contraception: IUD Last Pap: 2018. Results were: normal with negative HPV Last mammogram: 2019. Results were: normal, history of fibrocystic breasts.   Obstetric History OB History  Gravida Para Term Preterm AB Living  2 2       2   SAB TAB Ectopic Multiple Live Births               # Outcome Date GA Lbr Len/2nd Weight Sex Delivery Anes PTL Lv  2 Para      Vag-Spont     1 Para      Vag-Spont       Past Medical History:  Diagnosis Date  . Normocytic anemia due to blood loss 04/27/2018  . Obesity     Past Surgical History:  Procedure Laterality Date  . NO PAST SURGERIES      Current Outpatient Medications on File Prior to Visit  Medication Sig Dispense Refill  . Ascorbic Acid (VITAMIN C PO) Take by mouth.    . cetirizine (ZYRTEC) 10 MG chewable tablet Chew 10 mg by mouth daily.    . cholecalciferol (VITAMIN D) 1000 units tablet Take 1,000 Units by mouth daily.    Marland Kitchen desogestrel-ethinyl estradiol (KARIVA,AZURETTE,MIRCETTE) 0.15-0.02/0.01 MG (21/5) tablet Take 1 tablet by mouth daily. 3 Package 4  . levonorgestrel (LILETTA) 19.5 MCG/DAY IUD IUD 1 each by Intrauterine route once.    Marland Kitchen VITAMIN E PO Take by mouth.     No current facility-administered medications on file prior to visit.     No Known Allergies  Social History:  reports that she has never smoked. She has never used smokeless tobacco. She reports that she does not drink alcohol or use drugs.  Family History  Problem Relation Age of Onset  . Hypertension Mother   . Hypertension Father   . Prostate cancer Father   . Diabetes Maternal  Grandfather   . Colon cancer Paternal Grandfather   . Breast cancer Maternal Uncle   . Breast cancer Paternal Aunt   . Thyroid disease Brother   . Thyroid disease Sister     The following portions of the patient's history were reviewed and updated as appropriate: allergies, current medications, past family history, past medical history, past social history, past surgical history and problem list.  Review of Systems Pertinent items noted in HPI and remainder of comprehensive ROS otherwise negative.  Physical Exam:  BP 105/67   Pulse 87   Ht 5\' 4"  (1.626 m)   Wt 200 lb (90.7 kg)   BMI 34.33 kg/m  CONSTITUTIONAL: Well-developed, well-nourished female in no acute distress.  HENT:  Normocephalic, atraumatic, External right and left ear normal. Oropharynx is clear and moist EYES: Conjunctivae and EOM are normal. Pupils are equal, round, and reactive to light. No scleral icterus.  NECK: Normal range of motion, supple, no masses.  Normal thyroid.  SKIN: Skin is warm and dry. No rash noted. Not diaphoretic. No erythema. No pallor. MUSCULOSKELETAL: Normal range of motion. No tenderness.  No cyanosis, clubbing, or edema.  2+ distal pulses. NEUROLOGIC: Alert and oriented to person, place, and time. Normal reflexes, muscle tone coordination. No  cranial nerve deficit noted. PSYCHIATRIC: Normal mood and affect. Normal behavior. Normal judgment and thought content. CARDIOVASCULAR: Normal heart rate noted, regular rhythm RESPIRATORY: Clear to auscultation bilaterally. Effort and breath sounds normal, no problems with respiration noted. BREASTS: Symmetric in size. No masses, skin changes, nipple drainage, or lymphadenopathy. ABDOMEN: Soft, normal bowel sounds, no distention noted.  No tenderness, rebound or guarding.  PELVIC: Normal appearing external genitalia; normal appearing vaginal mucosa and cervix.  No abnormal discharge noted. Normal uterine size, no other palpable masses, no uterine or adnexal  tenderness. IUD strings visualized, about 2 cm in length outside cervix.   Assessment and Plan:   1. Encounter for annual routine gynecological examination  - HIV antibody (with reflex) - Declines flu shot today - Last pap last year - Mammogram scheduled for next week.   2. IUD check up  Spotting is normal. If it continues would consider pelvic US to assess location of IUD. She has no pain.  Patient to keep IUD in place for up to seven years; can come in for removal if she desires pregnancy earlier or for any concerning side effects.  Mammogram scheduled Routine preventative health maintenance measures emphasized. Please refer to After Visit Summary for other counseling recommendations.    Jnai Snellgrove, Artist Pais, [place] for [employer], [place]

## 2018-11-22 LAB — HIV ANTIBODY (ROUTINE TESTING W REFLEX): HIV 1&2 Ab, 4th Generation: NONREACTIVE

## 2018-12-20 ENCOUNTER — Ambulatory Visit (INDEPENDENT_AMBULATORY_CARE_PROVIDER_SITE_OTHER): Payer: 59

## 2018-12-20 ENCOUNTER — Other Ambulatory Visit: Payer: Self-pay

## 2018-12-20 DIAGNOSIS — Z1231 Encounter for screening mammogram for malignant neoplasm of breast: Secondary | ICD-10-CM

## 2019-02-22 DIAGNOSIS — H52223 Regular astigmatism, bilateral: Secondary | ICD-10-CM | POA: Diagnosis not present

## 2019-03-06 MED FILL — PIMTREA 28 DAY TABLET: 0.15-0.02/0 | 84 days supply | Qty: 84 | Fill #4

## 2019-07-23 DIAGNOSIS — Z20822 Contact with and (suspected) exposure to covid-19: Secondary | ICD-10-CM | POA: Diagnosis not present

## 2019-11-04 ENCOUNTER — Other Ambulatory Visit: Payer: Self-pay | Admitting: Certified Nurse Midwife

## 2019-11-04 DIAGNOSIS — Z1231 Encounter for screening mammogram for malignant neoplasm of breast: Secondary | ICD-10-CM

## 2019-11-26 ENCOUNTER — Ambulatory Visit (INDEPENDENT_AMBULATORY_CARE_PROVIDER_SITE_OTHER): Payer: 59 | Admitting: Advanced Practice Midwife

## 2019-11-26 ENCOUNTER — Encounter: Payer: Self-pay | Admitting: Advanced Practice Midwife

## 2019-11-26 ENCOUNTER — Other Ambulatory Visit (HOSPITAL_COMMUNITY)
Admission: RE | Admit: 2019-11-26 | Discharge: 2019-11-26 | Disposition: A | Discharge status: Home / Self Care | Payer: 59 | Source: Ambulatory Visit | Attending: Advanced Practice Midwife | Admitting: Advanced Practice Midwife

## 2019-11-26 ENCOUNTER — Other Ambulatory Visit: Payer: Self-pay

## 2019-11-26 VITALS — BP 104/68 | HR 80 | Ht 64.0 in | Wt 206.0 lb

## 2019-11-26 DIAGNOSIS — Z01419 Encounter for gynecological examination (general) (routine) without abnormal findings: Secondary | ICD-10-CM | POA: Insufficient documentation

## 2019-11-26 DIAGNOSIS — N3941 Urge incontinence: Secondary | ICD-10-CM | POA: Diagnosis not present

## 2019-11-26 NOTE — Patient Instructions (Signed)
Patient education: Pelvic floor muscle exercises (Beyond the Basics)  Author:Linda Albesa Seen, MD, FACOGSection Editor:Robert Jesse Fall, MDDeputy Ricci Barker, MD, FACOG   INTRODUCTION The "pelvic floor" refers to a group of muscles that support the organs in the pelvis. These organs include the bladder and rectum; in the female pelvis, they also include the uterus (figure 1).  The pelvic floor muscles play an important role in bladder and bowel control. Like any muscles, they can become injured or weakened. Contributing factors can include pregnancy, vaginal childbirth, obesity, and certain types of surgery; however, these muscles can also become weaker over time due to normal aging.  WHAT DO PELVIC FLOOR MUSCLE EXERCISES DO? The goal of these exercises (which are sometimes called "Kegel" exercises) is to strengthen the pelvic floor muscles. When these muscles become weak, it increases the risk of problems such as:  ?Urinary incontinence - This is when a person leaks urine or loses bladder control. One type, "stress incontinence," occurs when the muscles and tissues around the urethra (figure 2) do not stay closed properly when there is increased pressure in the abdomen (for example, when the person coughs, sneezes, or does something strenuous). Stress incontinence is common in people who have given birth. It can also happen after surgery to treat prostate cancer or an enlarged prostate.  Another type is called "urge incontinence"; this is when a person regularly feels a sudden urgent need to urinate.  ?Fecal incontinence - This refers to the involuntary loss of liquid or solid stool. "Anal incontinence" can also mean the involuntary passing of gas. Injury to the pelvic floor muscles (for example, during vaginal childbirth) can contribute to incontinence.  ?Pelvic organ prolapse - This is when the bladder, rectum, or uterus drop down and bulge into the vagina. This can happen if the  pelvic floor is weakened and unable to support the organs (figure 3). While some people with pelvic organ prolapse have no symptoms, others notice a feeling of fullness or a bulge in the vagina.  If you have any of these problems, doing exercises to strengthen your pelvic floor may help improve symptoms (see 'Treating existing problems' below). These exercises have not consistently been shown to prevent new problems from developing. (See 'Reducing the risk of new problems' below.)  If you are interested in trying pelvic floor muscle exercises, it is a good idea to talk with your health care provider first. There are some situations in which these exercises are not recommended: for example, in the case of certain types of injury that can result from childbirth (which need to heal before exercise can be beneficial). These exercises may also worsen symptoms in people with a condition called myofascial pelvic pain syndrome (in which abnormalities of the pelvic muscles and surrounding tissues can cause pain with sex or bladder problems). People with this condition are typically treated by a physical therapist with specialized training.  Your provider can help you understand whether pelvic floor muscle exercises are likely to be helpful for your situation, teach you how to do the exercises correctly, and refer you to a physical therapist if needed. (See 'The role of the physical therapist' below.)  Real As mentioned above, your health care provider can help you identify which muscles to contract for these exercises. They might do this by inserting a finger into your vagina and asking you to squeeze your pelvic muscles.  You will want to tighten the muscles as though you are trying to stop the  flow of urine or hold back gas. However, it is not recommended that you practice this by actually stopping the flow of urine while you are on the toilet. There is concern that this could lead to a  urinary tract infection. It may help to imagine that you are sitting on a marble (do not use a real marble) and using your pelvic muscles to lift it off the chair. Think about squeezing the muscles closest to your vagina and anus.  While it can be challenging to learn how to contract your pelvic floor muscles without using your abdominal, buttock, or thigh muscles, it is important to learn to do so in order for the exercises to be effective. This will get easier with time and practice.  Once you have figured out how to isolate the right muscles, you can begin to strengthen them. To do this:  ?Contract - Squeeze your pelvic floor muscles.  ?Hold - Keep contracting the muscles for 8 to 10 seconds. In the beginning, you may not be able to hold the contraction for this long, but over time, you will build up strength.  ?Relax - Relax your pelvic floor fully. This step is as important as contracting the muscles.  Over time, try to hold the muscle contraction harder and for a longer time before relaxing. As with other forms of exercise, you will become stronger with practice, and you will need to keep up your routine in order to notice long-term effects. It can help to work with a pelvic floor physical therapist; these are trained professionals who can teach you how to do these exercises effectively. (See 'The role of the physical therapist' below.)  You can do these exercises in any position (standing, sitting, or lying down) and work them into your daily routine in a way that is convenient for you.  How often should I do these exercises? -- A typical regimen involves doing this exercise (to contract your pelvic floor muscles, hold, then relax) 8 to 12 times per session, for three sessions every day, if possible. This routine should continue for at least 15 to 20 weeks. Your health care provider can talk to you about your specific situation and whether you should follow a different regimen. It takes time  to strengthen your pelvic floor muscles, especially if they have been weakened or injured, so try to be patient and keep working on it.  The role of the physical therapist -- Some people benefit from working with a physical therapist or specially trained nurse. This can help to ensure that you are using the correct technique in order to get the most out of your exercises.  In addition, these providers may use other methods to help you improve your technique and maximize results, such as:  ?Biofeedback - This typically involves inserting a sensor into your vagina that can identify which muscles you are contracting and measure the strength of each contraction. This can help if you are having trouble isolating your pelvic floor muscles and can also give you an idea of your progress as you strengthen these muscles over time.  ?Electrical stimulation - This can be done along with biofeedback. It involves placing a device into the vagina or anus; the device delivers a small electrical current that causes the pelvic floor muscles to contract.  ?Vaginal weights - You can purchase weighted cones that you hold in your vagina to help increase strength. You use your pelvic floor muscles to keep the weight in place  during your normal daily activities. While there is limited evidence supporting this approach, some people find that it helps them strengthen their pelvic floor. Vaginal weights are easy to use, relatively inexpensive, and can be purchased online.  BENEFITS OF PELVIC FLOOR MUSCLE EXERCISES  Treating existing problems -- In addition to strengthening your pelvic floor muscles in general, exercises can sometimes help in the following situations:  ?Preventing leakage of urine in stress incontinence - Stress incontinence (ie, leaking urine when doing certain things that stress the pelvic muscles) is common. Once you know how to contract your pelvic floor muscles effectively, you can get into the habit of  doing this any time you are about to laugh, cough, sneeze, lift something heavy, or do anything else that might cause leakage.  ?Controlling sudden urges to urinate - People with urge incontinence feel a strong need to urinate all of a sudden. If you have this urge, rather than running to the bathroom, sit or stand still and contract your pelvic muscles. Once the urge decreases, you can then go to the toilet.  ?Improving fecal and anal incontinence (the involuntary leakage of stool or gas).  ?Relieving symptoms of pelvic organ prolapse, such as a feeling of fullness or pressure in the vagina.  If you have any of these problems and pelvic floor muscle exercises do not seem to be helping after several months, talk to your health care provider. They may recommend changing the way you do the exercises or trying other approaches. While pelvic floor muscle exercises can be beneficial, many people with incontinence or pelvic organ prolapse need other treatments as well. (See "Patient education: Urinary incontinence in women (Beyond the Basics)" and "Patient education: Urinary incontinence treatments for women (Beyond the Basics)" and "Patient education: Fecal incontinence (Beyond the Basics)".)  Reducing the risk of new problems -- People often wonder whether they can reduce their risk of developing incontinence or pelvic organ prolapse after pregnancy and childbirth. Evidence is mixed as to whether doing pelvic muscle exercises during pregnancy can help with this. While there is no way to definitively prevent the pelvic floor from becoming weakened (due to pregnancy, childbirth, obesity, or just normal aging), exercises may help since they strengthen the muscles that support the pelvic organs. In addition, pelvic floor exercises are unlikely to be harmful in most situations.  WHERE TO GET MORE INFORMATION Your health care provider is the best source of information for questions and concerns related to your  medical problem.  This article will be updated as needed on our web site ([email or web address]). Related topics for patients, as well as selected articles written for health care professionals, are also available. Some of the most relevant are listed below.  Patient level information -- UpToDate offers two types of patient education materials.  The Basics -- The Basics patient education pieces answer the four or five key questions a patient might have about a given condition. These articles are best for patients who want a general overview and who prefer short, easy-to-read materials.  Patient education: Pelvic muscle (Kegel) exercises (The Basics) Patient education: Urinary incontinence (The Basics) Patient education: Treatments for urgency incontinence in women (The Basics) Patient education: Fecal incontinence (The Basics) Patient education: Pelvic organ prolapse (The Basics)  Beyond the Basics -- Beyond the Basics patient education pieces are longer, more sophisticated, and more detailed. These articles are best for patients who want in-depth information and are comfortable with some medical jargon.  Patient education: Urinary incontinence in women (Beyond  the Basics) Patient education: Urinary incontinence treatments for women (Beyond the Basics)  Professional level information -- Professional level articles are designed to keep doctors and other health professionals up-to-date on the latest medical findings. These articles are thorough, long, and complex, and they contain multiple references to the research on which they are based. Professional level articles are best for people who are comfortable with a lot of medical terminology and who want to read the same materials their doctors are reading.  Effect of pregnancy and childbirth on urinary incontinence and pelvic organ prolapse Treatment of urinary incontinence in females Pelvic organ prolapse in women: Epidemiology, risk  factors, clinical manifestations, and management Sexual function in women with pelvic floor and lower urinary tract disorders  The following organizations also provide reliable health information.  ?[employer]       (TourneyLocator.at.html)  ?American Academy of Family Physicians       (www.familydoctor.org)  ?Pelvic Floor Disorders Research Foundation       (www.voicesforpfd.org)  ?[employer]       1-800-BLADDER      (https://www.dixon-ward.com/) Use of UpToDate is subject to the Subscription and License Agreement. Topic 8403 Version 26.0

## 2019-11-26 NOTE — Progress Notes (Signed)
Subjective:     Joanna Hayes is a 44 y.o. female here at [facility] for a routine exam.  Current complaints: Doing well overall, reports some occasional urine leakage.  Personal health questionnaire reviewed: yes.  Do you have a primary care provider? yes   Gynecologic History No LMP recorded. (Menstrual status: IUD). Contraception: IUD Last Pap: 2018 Results were: normal Last mammogram: 2020. Results were: normal  Obstetric History OB History  Gravida Para Term Preterm AB Living  2 2       2   SAB TAB Ectopic Multiple Live Births               # Outcome Date GA Lbr Len/2nd Weight Sex Delivery Anes PTL Lv  2 Para      Vag-Spont     1 Para      Vag-Spont        The following portions of the patient's history were reviewed and updated as appropriate: allergies, current medications, past family history, past medical history, past social history, past surgical history and problem list.  Review of Systems Pertinent items noted in HPI and remainder of comprehensive ROS otherwise negative.    Objective:   BP 104/68   Pulse 80   Ht 5\' 4"  (1.626 m)   Wt 206 lb (93.4 kg)   BMI 35.36 kg/m    VS reviewed, nursing note reviewed,  Constitutional: well developed, well nourished, no distress HEENT: normocephalic CV: normal rate Pulm/chest wall: normal effort Breast Exam:  right breast normal without mass, skin or nipple changes or axillary nodes, left breast normal without mass, skin or nipple changes or axillary nodes Abdomen: soft Neuro: alert and oriented x 3 Skin: warm, dry Psych: affect normal Pelvic exam: Cervix pink, visually closed, without lesion, scant white creamy discharge, IUD strings visible at cervical os, vaginal walls and external genitalia normal Bimanual exam: Cervix 0/long/high, firm, anterior, neg CMT, uterus nontender, nonenlarged, adnexa without tenderness, enlargement, or mass     Assessment/Plan:   1. Well woman exam with routine gynecological  exam --Doing well, occasional spotting only with IUD, is happy with bleeding pattern  - Cytology - PAP( [facility])  2. Urge incontinence --Pt with incomplete emptying sometimes urge incontinence --Discussed options, including PT. Pt to try pelvic floor exercises at home first, printed materials and website information given. --Consider formal PT if needed     Follow up in: 1 year or as needed.   Fatima Blank, CNM 2:08 PM

## 2019-11-28 LAB — CYTOLOGY - PAP
Comment: NEGATIVE
Diagnosis: NEGATIVE
High risk HPV: NEGATIVE

## 2019-11-29 ENCOUNTER — Ambulatory Visit (INDEPENDENT_AMBULATORY_CARE_PROVIDER_SITE_OTHER): Payer: 59 | Admitting: Medical-Surgical

## 2019-11-29 ENCOUNTER — Other Ambulatory Visit: Payer: Self-pay

## 2019-11-29 VITALS — BP 115/79 | HR 80 | Ht 64.0 in | Wt 206.0 lb

## 2019-11-29 DIAGNOSIS — Z131 Encounter for screening for diabetes mellitus: Secondary | ICD-10-CM | POA: Diagnosis not present

## 2019-11-29 DIAGNOSIS — Z1211 Encounter for screening for malignant neoplasm of colon: Secondary | ICD-10-CM

## 2019-11-29 DIAGNOSIS — Z8349 Family history of other endocrine, nutritional and metabolic diseases: Secondary | ICD-10-CM | POA: Diagnosis not present

## 2019-11-29 DIAGNOSIS — Z0000 Encounter for general adult medical examination without abnormal findings: Secondary | ICD-10-CM

## 2019-11-29 DIAGNOSIS — E785 Hyperlipidemia, unspecified: Secondary | ICD-10-CM | POA: Diagnosis not present

## 2019-11-29 DIAGNOSIS — E559 Vitamin D deficiency, unspecified: Secondary | ICD-10-CM

## 2019-11-29 NOTE — Progress Notes (Signed)
HPI: Joanna Hayes is a 44 y.o. female who  has a past medical history of Normocytic anemia due to blood loss (04/27/2018) and Obesity.  she presents to [facility] today, 11/29/19,  for chief complaint of: Annual physical exam  Dentist- every 6 months, no concerns Eye exam- December 2020, wears glasses Diet- three meals daily, decreasing snacking, limited sodas, drinks water, occasional alcohol Exercise- regular exercise weekly  Concern: family history of colon cancer in father and grandfather. Both men were found to have colon changes in their 41s. Her father's GI provider recommended his children begin their colon cancer screening early  Past medical, surgical, social and family history reviewed:  Patient Active Problem List   Diagnosis Date Noted  . IUD check up 11/21/2018  . Normocytic anemia due to blood loss 04/27/2018  . Family history of thyroid disease 04/23/2018  . Class 1 obesity due to excess calories with body mass index (BMI) of 34.0 to 34.9 in adult 04/23/2018  . Encounter for annual routine gynecological examination 04/23/2018  . Vitamin D insufficiency 04/12/2016  . Obesity (BMI 30.0-34.9) 04/11/2016  . Intermittent low back pain 04/11/2016  . Dyslipidemia 10/16/2013    Past Surgical History:  Procedure Laterality Date  . NO PAST SURGERIES      Social History   Tobacco Use  . Smoking status: Never Smoker  . Smokeless tobacco: Never Used  Substance Use Topics  . Alcohol use: No    Family History  Problem Relation Age of Onset  . Hypertension Mother   . Hypertension Father   . Prostate cancer Father   . Diabetes Maternal Grandfather   . Colon cancer Paternal Grandfather   . Breast cancer Maternal Uncle   . Breast cancer Paternal Aunt   . Thyroid disease Brother   . Thyroid disease Sister      Current medication list and allergy/intolerance information reviewed:    Current Outpatient Medications  Medication Sig  Dispense Refill  . Ascorbic Acid (VITAMIN C PO) Take by mouth.    . cetirizine (ZYRTEC) 10 MG chewable tablet Chew 10 mg by mouth daily.    . cholecalciferol (VITAMIN D) 1000 units tablet Take 1,000 Units by mouth daily.    Marland Kitchen ELDERBERRY PO Take by mouth daily.    Marland Kitchen levonorgestrel (LILETTA) 19.5 MCG/DAY IUD IUD 1 each by Intrauterine route once.     No current facility-administered medications for this visit.    No Known Allergies    Review of Systems:  Constitutional:  No  fever, no chills, No recent illness, No unintentional weight changes. No significant fatigue.   HEENT: No  headache, no vision change, no hearing change, No sore throat, No  sinus pressure  Cardiac: No  chest pain, No  pressure, No palpitations, No  Orthopnea  Respiratory:  No  shortness of breath. No  Cough  Gastrointestinal: No  abdominal pain, No  nausea, No  vomiting,  No  blood in stool, No  diarrhea, No  constipation   Musculoskeletal: No new myalgia/arthralgia  Skin: No  Rash, No other wounds/concerning lesions  Genitourinary: No  incontinence, No  abnormal genital bleeding, No abnormal genital discharge  Hem/Onc: No  easy bruising/bleeding, No  abnormal lymph node  Endocrine: No cold intolerance,  No heat intolerance. No polyuria/polydipsia/polyphagia   Neurologic: No  weakness, No  dizziness, No  slurred speech/focal weakness/facial droop  Psychiatric: No  concerns with depression, No  concerns with anxiety, No sleep problems, No mood  problems  Exam:  BP 115/79   Pulse 80   Ht '5\' 4"'  (1.626 m)   Wt 206 lb (93.4 kg)   SpO2 100%   BMI 35.36 kg/m   Constitutional: VS see above. General Appearance: alert, well-developed, well-nourished, NAD  Eyes: Normal lids and conjunctive, non-icteric sclera  Ears, Nose, Mouth, Throat: MMM, Normal external inspection ears/nares/mouth/lips/gums. TM normal bilaterally. Pharynx/tonsils no erythema, no exudate. Nasal mucosa normal.   Neck: No masses, trachea  midline. No thyroid enlargement. No tenderness/mass appreciated. No lymphadenopathy  Respiratory: Normal respiratory effort. no wheeze, no rhonchi, no rales  Cardiovascular: S1/S2 normal, no murmur, no rub/gallop auscultated. RRR. No lower extremity edema. Pedal pulse II/IV bilaterally DP and PT. No carotid bruit or JVD. No abdominal aortic bruit.  Gastrointestinal: Nontender, no masses. No hepatomegaly, no splenomegaly. No hernia appreciated. Bowel sounds normal. Rectal exam deferred.   Musculoskeletal: Gait normal. No clubbing/cyanosis of digits.   Neurological: Normal balance/coordination. No tremor. No cranial nerve deficit on limited exam. Motor and sensation intact and symmetric. Cerebellar reflexes intact.   Skin: warm, dry, intact. No rash/ulcer. No concerning nevi or subq nodules on limited exam.    Psychiatric: Normal judgment/insight. Normal mood and affect. Oriented x3.    No results found for this or any previous visit (from the past 72 hour(s)).  No results found.   ASSESSMENT/PLAN:   1. Encounter for annual physical exam Checking CBC with differential, CMP, lipid panel, and TSH today.  Up-to-date on all other preventative care. - CBC with Differential/Platelet - COMPLETE METABOLIC PANEL WITH GFR - Lipid Panel w/reflex Direct LDL - TSH  2. Family history of thyroid disease Checking TSH. - TSH  3. Dyslipidemia (high LDL; low HDL) Checking lipid panel. - Lipid Panel w/reflex Direct LDL  4. Screening for diabetes mellitus Checking CBC. - COMPLETE METABOLIC PANEL WITH GFR  5. Screening for colon cancer Discussed screening recommendations with patient.  Since she is two close family members with early onset colon changes, will go ahead and refer to gastroenterology for possible colonoscopy should they decide this is necessary. - Ambulatory referral to Gastroenterology  6. Vitamin D insufficiency Checking vitamin D level today. - Vitamin D 1,25  dihydroxy  Orders Placed This Encounter  Procedures  . CBC with Differential/Platelet  . COMPLETE METABOLIC PANEL WITH GFR  . Lipid Panel w/reflex Direct LDL  . TSH  . Vitamin D 1,25 dihydroxy  . Ambulatory referral to Gastroenterology    No orders of the defined types were placed in this encounter.   Patient Instructions  Preventive Care 72-21 Years Old, Female Preventive care refers to visits with your health care provider and lifestyle choices that can promote health and wellness. This includes:  A yearly physical exam. This may also be called an annual well check.  Regular dental visits and eye exams.  Immunizations.  Screening for certain conditions.  Healthy lifestyle choices, such as eating a healthy diet, getting regular exercise, not using drugs or products that contain nicotine and tobacco, and limiting alcohol use. What can I expect for my preventive care visit? Physical exam Your health care provider will check your:  Height and weight. This may be used to calculate body mass index (BMI), which tells if you are at a healthy weight.  Heart rate and blood pressure.  Skin for abnormal spots. Counseling Your health care provider may ask you questions about your:  Alcohol, tobacco, and drug use.  Emotional well-being.  Home and relationship well-being.  Sexual  activity.  Eating habits.  Work and work Statistician.  Method of birth control.  Menstrual cycle.  Pregnancy history. What immunizations do I need?  Influenza (flu) vaccine  This is recommended every year. Tetanus, diphtheria, and pertussis (Tdap) vaccine  You may need a Td booster every 10 years. Varicella (chickenpox) vaccine  You may need this if you have not been vaccinated. Zoster (shingles) vaccine  You may need this after age 52. Measles, mumps, and rubella (MMR) vaccine  You may need at least one dose of MMR if you were born in 1957 or later. You may also need a second  dose. Pneumococcal conjugate (PCV13) vaccine  You may need this if you have certain conditions and were not previously vaccinated. Pneumococcal polysaccharide (PPSV23) vaccine  You may need one or two doses if you smoke cigarettes or if you have certain conditions. Meningococcal conjugate (MenACWY) vaccine  You may need this if you have certain conditions. Hepatitis A vaccine  You may need this if you have certain conditions or if you travel or work in places where you may be exposed to hepatitis A. Hepatitis B vaccine  You may need this if you have certain conditions or if you travel or work in places where you may be exposed to hepatitis B. Haemophilus influenzae type b (Hib) vaccine  You may need this if you have certain conditions. Human papillomavirus (HPV) vaccine  If recommended by your health care provider, you may need three doses over 6 months. You may receive vaccines as individual doses or as more than one vaccine together in one shot (combination vaccines). Talk with your health care provider about the risks and benefits of combination vaccines. What tests do I need? Blood tests  Lipid and cholesterol levels. These may be checked every 5 years, or more frequently if you are over 63 years old.  Hepatitis C test.  Hepatitis B test. Screening  Lung cancer screening. You may have this screening every year starting at age 19 if you have a 30-pack-year history of smoking and currently smoke or have quit within the past 15 years.  Colorectal cancer screening. All adults should have this screening starting at age 27 and continuing until age 76. Your health care provider may recommend screening at age 50 if you are at increased risk. You will have tests every 1-10 years, depending on your results and the type of screening test.  Diabetes screening. This is done by checking your blood sugar (glucose) after you have not eaten for a while (fasting). You may have this done every  1-3 years.  Mammogram. This may be done every 1-2 years. Talk with your health care provider about when you should start having regular mammograms. This may depend on whether you have a family history of breast cancer.  BRCA-related cancer screening. This may be done if you have a family history of breast, ovarian, tubal, or peritoneal cancers.  Pelvic exam and Pap test. This may be done every 3 years starting at age 90. Starting at age 83, this may be done every 5 years if you have a Pap test in combination with an HPV test. Other tests  Sexually transmitted disease (STD) testing.  Bone density scan. This is done to screen for osteoporosis. You may have this scan if you are at high risk for osteoporosis. Follow these instructions at home: Eating and drinking  Eat a diet that includes fresh fruits and vegetables, whole grains, lean protein, and low-fat dairy.  Take  vitamin and mineral supplements as recommended by your health care provider.  Do not drink alcohol if: ? Your health care provider tells you not to drink. ? You are pregnant, may be pregnant, or are planning to become pregnant.  If you drink alcohol: ? Limit how much you have to 0-1 drink a day. ? Be aware of how much alcohol is in your drink. In the [place] one drink equals one 12 oz bottle of beer (355 mL), one 5 oz glass of wine (148 mL), or one 1 oz glass of hard liquor (44 mL). Lifestyle  Take daily care of your teeth and gums.  Stay active. Exercise for at least 30 minutes on 5 or more days each week.  Do not use any products that contain nicotine or tobacco, such as cigarettes, e-cigarettes, and chewing tobacco. If you need help quitting, ask your health care provider.  If you are sexually active, practice safe sex. Use a condom or other form of birth control (contraception) in order to prevent pregnancy and STIs (sexually transmitted infections).  If told by your health care provider, take low-dose aspirin daily  starting at age 65. What's next?  Visit your health care provider once a year for a well check visit.  Ask your health care provider how often you should have your eyes and teeth checked.  Stay up to date on all vaccines. This information is not intended to replace advice given to you by your health care provider. Make sure you discuss any questions you have with your health care provider. Document Revised: 11/23/2017 Document Reviewed: 11/23/2017 Elsevier Patient Education  [facility].  Follow-up plan: Return in about 1 year (around 11/28/2020) for Annual physical exam with labs or sooner if needed.  Clearnce Sorrel, DNP, APRN, FNP-BC [facility]

## 2019-11-29 NOTE — Patient Instructions (Signed)

## 2019-11-30 LAB — COMPLETE METABOLIC PANEL WITH GFR
AG Ratio: 1.4 (calc) (ref 1.0–2.5)
ALT: 17 U/L (ref 6–29)
AST: 31 U/L — ABNORMAL HIGH (ref 10–30)
Albumin: 4.2 g/dL (ref 3.6–5.1)
Alkaline phosphatase (APISO): 82 U/L (ref 31–125)
BUN: 13 mg/dL (ref 7–25)
CO2: 24 mmol/L (ref 20–32)
Calcium: 9.2 mg/dL (ref 8.6–10.2)
Chloride: 106 mmol/L (ref 98–110)
Creat: 0.89 mg/dL (ref 0.50–1.10)
GFR, Est African American: 93 mL/min/{1.73_m2} (ref >=60)
GFR, Est Non African American: 80 mL/min/{1.73_m2} (ref >=60)
Globulin: 2.9 g/dL (calc) (ref 1.9–3.7)
Glucose, Bld: 85 mg/dL (ref 65–99)
Potassium: 4.2 mmol/L (ref 3.5–5.3)
Sodium: 140 mmol/L (ref 135–146)
Total Bilirubin: 0.5 mg/dL (ref 0.2–1.2)
Total Protein: 7.1 g/dL (ref 6.1–8.1)

## 2019-11-30 LAB — CBC WITH DIFFERENTIAL/PLATELET
Absolute Monocytes: 476 cells/uL (ref 200–950)
Basophils Absolute: 39 cells/uL (ref 0–200)
Basophils Relative: 0.7 %
Eosinophils Absolute: 218 cells/uL (ref 15–500)
Eosinophils Relative: 3.9 %
HCT: 37.9 % (ref 35.0–45.0)
Hemoglobin: 12.8 g/dL (ref 11.7–15.5)
Lymphs Abs: 2094 cells/uL (ref 850–3900)
MCH: 31.8 pg (ref 27.0–33.0)
MCHC: 33.8 g/dL (ref 32.0–36.0)
MCV: 94 fL (ref 80.0–100.0)
MPV: 9.9 fL (ref 7.5–12.5)
Monocytes Relative: 8.5 %
Neutro Abs: 2772 cells/uL (ref 1500–7800)
Neutrophils Relative %: 49.5 %
Platelets: 302 10*3/uL (ref 140–400)
RBC: 4.03 10*6/uL (ref 3.80–5.10)
RDW: 12.2 % (ref 11.0–15.0)
Total Lymphocyte: 37.4 %
WBC: 5.6 10*3/uL (ref 3.8–10.8)

## 2019-11-30 LAB — LIPID PANEL W/REFLEX DIRECT LDL
Cholesterol: 172 mg/dL (ref <200)
HDL: 43 mg/dL — ABNORMAL LOW (ref >=50)
LDL Cholesterol (Calc): 115 mg/dL (calc) — ABNORMAL HIGH
Non-HDL Cholesterol (Calc): 129 mg/dL (calc) (ref <130)
Total CHOL/HDL Ratio: 4 (calc) (ref <5.0)
Triglycerides: 55 mg/dL (ref <150)

## 2019-11-30 LAB — TSH: TSH: 0.82 mIU/L

## 2019-12-20 ENCOUNTER — Encounter: Payer: Self-pay | Admitting: Gastroenterology

## 2019-12-25 ENCOUNTER — Ambulatory Visit (INDEPENDENT_AMBULATORY_CARE_PROVIDER_SITE_OTHER): Payer: 59

## 2019-12-25 ENCOUNTER — Other Ambulatory Visit: Payer: Self-pay

## 2019-12-25 DIAGNOSIS — Z1231 Encounter for screening mammogram for malignant neoplasm of breast: Secondary | ICD-10-CM

## 2020-01-28 ENCOUNTER — Encounter: Payer: Self-pay | Admitting: Gastroenterology

## 2020-01-28 ENCOUNTER — Other Ambulatory Visit: Payer: Self-pay | Admitting: Gastroenterology

## 2020-01-28 ENCOUNTER — Ambulatory Visit (INDEPENDENT_AMBULATORY_CARE_PROVIDER_SITE_OTHER): Payer: 59 | Admitting: Gastroenterology

## 2020-01-28 VITALS — BP 122/84 | HR 99 | Ht 63.0 in | Wt 207.0 lb

## 2020-01-28 DIAGNOSIS — Z8371 Family history of colonic polyps: Secondary | ICD-10-CM

## 2020-01-28 DIAGNOSIS — Z1211 Encounter for screening for malignant neoplasm of colon: Secondary | ICD-10-CM

## 2020-01-28 MED ORDER — NA SULFATE-K SULFATE-MG SULF 17.5-3.13-1.6 GM/177ML PO SOLN: 1.0000 | Freq: Once | ORAL | 0 refills | Status: DC

## 2020-01-28 MED FILL — SUPREP BOWEL PREP KIT: 17.5-3.13-1 | 1 days supply | Qty: 354 | Fill #0

## 2020-01-28 NOTE — Progress Notes (Signed)
History of Present Illness: This is a 44 year old female referred by Samuel Bouche, NP for the evaluation of a family history of colon polyps in her father and family history of colon cancer in her paternal grandfather.  She is an Therapist, sports working at [employer]  She has no ongoing colorectal complaints. Her father developed colon polyps in his 56s and is now in his mid 5s. The last few years he has had annual colonoscopies due to the number of polyps. No prior colonoscopy. One sibling has had a negative colonoscopy. Her 2 other sibling have not yet had colonoscopies. Denies weight loss, abdominal pain, constipation, diarrhea, change in stool caliber, melena, hematochezia, nausea, vomiting, dysphagia, reflux symptoms, chest pain.    No Known Allergies Outpatient Medications Prior to Visit  Medication Sig Dispense Refill  . Ascorbic Acid (VITAMIN C PO) Take by mouth.    . cetirizine (ZYRTEC) 10 MG chewable tablet Chew 10 mg by mouth daily.    . cholecalciferol (VITAMIN D) 1000 units tablet Take 1,000 Units by mouth daily.    Marland Kitchen ELDERBERRY PO Take by mouth daily.    Marland Kitchen levonorgestrel (LILETTA) 19.5 MCG/DAY IUD IUD 1 each by Intrauterine route once.     No facility-administered medications prior to visit.   Past Medical History:  Diagnosis Date  . Normocytic anemia due to blood loss 04/27/2018  . Obesity    Past Surgical History:  Procedure Laterality Date  . NO PAST SURGERIES    . WISDOM TOOTH EXTRACTION     Social History   Socioeconomic History  . Marital status: Divorced    Spouse name: Not on file  . Number of children: Not on file  . Years of education: Not on file  . Highest education level: Not on file  Occupational History  . Not on file  Tobacco Use  . Smoking status: Never Smoker  . Smokeless tobacco: Never Used  Vaping Use  . Vaping Use: Never used  Substance and Sexual Activity  . Alcohol use: Yes    Comment: Occ   . Drug use: No  . Sexual activity: Yes    Partners: Male     Birth control/protection: I.U.D.    Comment: Husband Vasectomy  Other Topics Concern  . Not on file  Social History Narrative  . Not on file   Social Determinants of Health   Financial Resource Strain:   . Difficulty of Paying Living Expenses: Not on file  Food Insecurity:   . Worried About Charity fundraiser in the Last Year: Not on file  . Ran Out of Food in the Last Year: Not on file  Transportation Needs:   . Lack of Transportation (Medical): Not on file  . Lack of Transportation (Non-Medical): Not on file  Physical Activity:   . Days of Exercise per Week: Not on file  . Minutes of Exercise per Session: Not on file  Stress:   . Feeling of Stress : Not on file  Social Connections:   . Frequency of Communication with Friends and Family: Not on file  . Frequency of Social Gatherings with Friends and Family: Not on file  . Attends Religious Services: Not on file  . Active Member of Clubs or Organizations: Not on file  . Attends Archivist Meetings: Not on file  . Marital Status: Not on file   Family History  Problem Relation Age of Onset  . Hypertension Mother   . Hypertension Father   .  Prostate cancer Father   . Colonic polyp Father   . Diabetes Maternal Grandfather   . Colon cancer Paternal Grandfather   . AAA (abdominal aortic aneurysm) Paternal Grandfather   . Breast cancer Maternal Uncle   . Breast cancer Paternal Aunt   . Thyroid disease Brother   . Thyroid disease Sister      Review of Systems: Pertinent positive and negative review of systems were noted in the above HPI section. All other review of systems were otherwise negative.   Physical Exam: General: Well developed, well nourished, no acute distress Head: Normocephalic and atraumatic Eyes:  sclerae anicteric, EOMI Ears: Normal auditory acuity Mouth: Not examined, mask on during Covid-19 pandemic Neck: Supple, no masses or thyromegaly Lungs: Clear throughout to auscultation Heart:  Regular rate and rhythm; no murmurs, rubs or bruits Abdomen: Soft, non tender and non distended. No masses, hepatosplenomegaly or hernias noted. Normal Bowel sounds Rectal: Deferred to colonoscopy Musculoskeletal: Symmetrical with no gross deformities  Skin: No lesions on visible extremities Pulses:  Normal pulses noted Extremities: No clubbing, cyanosis, edema or deformities noted Neurological: Alert oriented x 4, grossly nonfocal Cervical Nodes:  No significant cervical adenopathy Inguinal Nodes: No significant inguinal adenopathy Psychological:  Alert and cooperative. Normal mood and affect   Assessment and Recommendations:  1.  Family history of colon polyps, first-degree relative (father). Father with multiple polyps and currently having annual colonoscopies for polyp surveillance. Family history of colon cancer, second-degree relative (PGF). Encouraged her to encourage her siblings to have colonoscopies. Schedule colonoscopy.  The risks (including bleeding, perforation, infection, missed lesions, medication reactions and possible hospitalization or surgery if complications occur), benefits, and alternatives to colonoscopy with possible biopsy and possible polypectomy were discussed with the patient and they consent to proceed.     cc: Samuel Bouche, [place] [address] [place],  [place] [id number]

## 2020-01-28 NOTE — Patient Instructions (Signed)
You have been scheduled for a colonoscopy. Please follow written instructions given to you at your visit today.  Please pick up your prep supplies at the pharmacy within the next 1-3 days. If you use inhalers (even only as needed), please bring them with you on the day of your procedure.  Thank you for choosing me and [facility].  Malcolm T. Stark, Jr., MD., FACG  

## 2020-02-28 DIAGNOSIS — H52223 Regular astigmatism, bilateral: Secondary | ICD-10-CM | POA: Diagnosis not present

## 2020-03-10 ENCOUNTER — Encounter: Payer: Self-pay | Admitting: Gastroenterology

## 2020-03-12 ENCOUNTER — Encounter: Payer: Self-pay | Admitting: Gastroenterology

## 2020-03-12 ENCOUNTER — Ambulatory Visit (AMBULATORY_SURGERY_CENTER): Payer: 59 | Admitting: Gastroenterology

## 2020-03-12 ENCOUNTER — Other Ambulatory Visit: Payer: Self-pay

## 2020-03-12 VITALS — BP 99/55 | HR 70 | Temp 98.1°F | Resp 13 | Ht 63.0 in | Wt 207.0 lb

## 2020-03-12 DIAGNOSIS — Z1211 Encounter for screening for malignant neoplasm of colon: Secondary | ICD-10-CM | POA: Diagnosis not present

## 2020-03-12 DIAGNOSIS — Z86010 Personal history of colonic polyps: Secondary | ICD-10-CM | POA: Diagnosis not present

## 2020-03-12 DIAGNOSIS — Z800 Family history of malignant neoplasm of digestive organs: Secondary | ICD-10-CM | POA: Diagnosis not present

## 2020-03-12 DIAGNOSIS — Z8371 Family history of colonic polyps: Secondary | ICD-10-CM

## 2020-03-12 HISTORY — PX: COLONOSCOPY: SHX174

## 2020-03-12 MED ORDER — SODIUM CHLORIDE 0.9 % IV SOLN: 500.0000 mL | Freq: Once | INTRAVENOUS | Status: DC

## 2020-03-12 NOTE — Op Note (Addendum)
[facility] Patient Name: Joanna Hayes Procedure Date: 03/12/2020 2:55 PM MRN: [id number] Endoscopist: Ladene Artist , MD Age: 44 Referring MD:  Date of Birth: 04-Jun-1975 Gender: Female Account #: [id number] Procedure:                Colonoscopy Indications:              Colon cancer screening in patient at increased                            risk: Family history of 1st-degree relative with                            colon polyps before age 50 years. Family history of                            2nd degree relative with colon cancer. Medicines:                Monitored Anesthesia Care Procedure:                Pre-Anesthesia Assessment:                           - Prior to the procedure, a History and Physical                            was performed, and patient medications and                            allergies were reviewed. The patient's tolerance of                            previous anesthesia was also reviewed. The risks                            and benefits of the procedure and the sedation                            options and risks were discussed with the patient.                            All questions were answered, and informed consent                            was obtained. Prior Anticoagulants: The patient has                            taken no previous anticoagulant or antiplatelet                            agents. ASA Grade Assessment: II - A patient with                            mild systemic disease. After reviewing the risks  and benefits, the patient was deemed in                            satisfactory condition to undergo the procedure.                           After obtaining informed consent, the colonoscope                            was passed under direct vision. Throughout the                            procedure, the patient's blood pressure, pulse, and                            oxygen saturations were  monitored continuously. The                            Olympus CF-HQ190L [id number]) Colonoscope was                            introduced through the anus and advanced to the the                            cecum, identified by appendiceal orifice and                            ileocecal valve. The ileocecal valve, appendiceal                            orifice, and rectum were photographed. The quality                            of the bowel preparation was good. The colonoscopy                            was performed without difficulty. The patient                            tolerated the procedure well. Scope In: 3:00:19 PM Scope Out: 3:15:24 PM Scope Withdrawal Time: 0 hours 10 minutes 13 seconds  Total Procedure Duration: 0 hours 15 minutes 5 seconds  Findings:                 The perianal and digital rectal examinations were                            normal.                           Internal hemorrhoids were found during                            retroflexion. The hemorrhoids were small and Grade  I (internal hemorrhoids that do not prolapse).                           The exam was otherwise without abnormality on                            direct and retroflexion views. Complications:            No immediate complications. Estimated blood loss:                            None. Estimated Blood Loss:     Estimated blood loss: none. Impression:               - Small internal hemorrhoids.                           - Otherwise the examined colon was normal on direct                            and retroflexion views.                           - No specimens collected. Recommendation:           - Repeat colonoscopy in 5 years for screening                            purposes.                           - Patient has a contact number available for                            emergencies. The signs and symptoms of potential                            delayed  complications were discussed with the                            patient. Return to normal activities tomorrow.                            Written discharge instructions were provided to the                            patient.                           - Resume previous diet.                           - Continue present medications. Ladene Artist, MD 03/12/2020 3:20:31 PM This report has been signed electronically.

## 2020-03-12 NOTE — Patient Instructions (Signed)
YOU HAD AN ENDOSCOPIC PROCEDURE TODAY AT THE [facility]:   Refer to the procedure report that was given to you for any specific questions about what was found during the examination.  If the procedure report does not answer your questions, please call your gastroenterologist to clarify.  If you requested that your care partner not be given the details of your procedure findings, then the procedure report has been included in a sealed envelope for you to review at your convenience later.  YOU SHOULD EXPECT: Some feelings of bloating in the abdomen. Passage of more gas than usual.  Walking can help get rid of the air that was put into your GI tract during the procedure and reduce the bloating. If you had a lower endoscopy (such as a colonoscopy or flexible sigmoidoscopy) you may notice spotting of blood in your stool or on the toilet paper. If you underwent a bowel prep for your procedure, you may not have a normal bowel movement for a few days.  Please Note:  You might notice some irritation and congestion in your nose or some drainage.  This is from the oxygen used during your procedure.  There is no need for concern and it should clear up in a day or so.  SYMPTOMS TO REPORT IMMEDIATELY:   Following lower endoscopy (colonoscopy or flexible sigmoidoscopy):  Excessive amounts of blood in the stool  Significant tenderness or worsening of abdominal pains  Swelling of the abdomen that is new, acute  Fever of 100F or higher  For urgent or emergent issues, a gastroenterologist can be reached at any hour by calling [phone]. Do not use MyChart messaging for urgent concerns.    DIET:  We do recommend a small meal at first, but then you may proceed to your regular diet.  Drink plenty of fluids but you should avoid alcoholic beverages for 24 hours.  ACTIVITY:  You should plan to take it easy for the rest of today and you should NOT DRIVE or use heavy machinery until tomorrow (because  of the sedation medicines used during the test).    FOLLOW UP: Our staff will call the number listed on your records 48-72 hours following your procedure to check on you and address any questions or concerns that you may have regarding the information given to you following your procedure. If we do not reach you, we will leave a message.  We will attempt to reach you two times.  During this call, we will ask if you have developed any symptoms of COVID 19. If you develop any symptoms (ie: fever, flu-like symptoms, shortness of breath, cough etc.) before then, please call [phone].  If you test positive for Covid 19 in the 2 weeks post procedure, please call and report this information to us.    If any biopsies were taken you will be contacted by phone or by letter within the next 1-3 weeks.  Please call us at [phone] if you have not heard about the biopsies in 3 weeks.    SIGNATURES/CONFIDENTIALITY: You and/or your care partner have signed paperwork which will be entered into your electronic medical record.  These signatures attest to the fact that that the information above on your After Visit Summary has been reviewed and is understood.  Full responsibility of the confidentiality of this discharge information lies with you and/or your care-partner. 

## 2020-03-12 NOTE — Progress Notes (Signed)
To PACU, VSS. Report to [email or web address] 

## 2020-03-12 NOTE — Progress Notes (Signed)
Vital signs checked by:CW ? ?The medical and surgical history was reviewed and verified with the patient. ? ?

## 2020-03-16 ENCOUNTER — Telehealth: Payer: Self-pay

## 2020-03-16 NOTE — Telephone Encounter (Signed)
°  Follow up Call-  Call back number 03/12/2020  Post procedure Call Back phone  # [phone]  Permission to leave phone message Yes  Some recent data might be hidden     Patient questions:  Do you have a fever, pain , or abdominal swelling? No. Pain Score  0 *  Have you tolerated food without any problems? Yes.    Have you been able to return to your normal activities? Yes.    Do you have any questions about your discharge instructions: Diet   No. Medications  No. Follow up visit  No.  Do you have questions or concerns about your Care? No.  Actions: * If pain score is 4 or above: No action needed, pain <4. 1. Have you developed a fever since your procedure? no  2.   Have you had an respiratory symptoms (SOB or cough) since your procedure? no  3.   Have you tested positive for COVID 19 since your procedure no  4.   Have you had any family members/close contacts diagnosed with the COVID 19 since your procedure?  no   If yes to any of these questions please route to Joylene John, RN and Joella Prince, RN

## 2020-04-27 ENCOUNTER — Other Ambulatory Visit (HOSPITAL_BASED_OUTPATIENT_CLINIC_OR_DEPARTMENT_OTHER): Payer: Self-pay | Admitting: Internal Medicine

## 2020-04-27 ENCOUNTER — Ambulatory Visit: Payer: Self-pay | Attending: Internal Medicine

## 2020-04-27 DIAGNOSIS — Z23 Encounter for immunization: Secondary | ICD-10-CM

## 2020-04-27 MED FILL — PFIZER-BIONTECH COVID-19 VA: 30 | 21 days supply | Qty: 0 | Fill #0

## 2020-04-27 NOTE — Progress Notes (Signed)
   Covid-19 Vaccination Clinic  Name:  Joanna Hayes    MRN: [id number] DOB: 06-11-1975  04/27/2020  Joanna Hayes was observed post Covid-19 immunization for 15 minutes without incident. She was provided with Vaccine Information Sheet and instruction to access the V-Safe system.   Joanna Hayes was instructed to call 911 with any severe reactions post vaccine: Marland Kitchen Difficulty breathing  . Swelling of face and throat  . A fast heartbeat  . A bad rash all over body  . Dizziness and weakness   Immunizations Administered    Name Date Dose VIS Date Route   Pfizer COVID-19 Vaccine 04/27/2020  2:27 PM 0.3 mL 01/15/2020 Intramuscular   Manufacturer: [place]   Lot: [id number]   NDC: [address]-1

## 2020-11-10 ENCOUNTER — Other Ambulatory Visit: Payer: Self-pay | Admitting: Certified Nurse Midwife

## 2020-11-10 DIAGNOSIS — Z1231 Encounter for screening mammogram for malignant neoplasm of breast: Secondary | ICD-10-CM

## 2020-12-01 ENCOUNTER — Other Ambulatory Visit: Payer: Self-pay

## 2020-12-01 ENCOUNTER — Ambulatory Visit (INDEPENDENT_AMBULATORY_CARE_PROVIDER_SITE_OTHER): Payer: No Typology Code available for payment source | Admitting: Medical-Surgical

## 2020-12-01 ENCOUNTER — Encounter: Payer: Self-pay | Admitting: Medical-Surgical

## 2020-12-01 VITALS — BP 100/66 | HR 76 | Resp 20 | Ht 63.0 in | Wt 211.0 lb

## 2020-12-01 DIAGNOSIS — Z131 Encounter for screening for diabetes mellitus: Secondary | ICD-10-CM

## 2020-12-01 DIAGNOSIS — Z1159 Encounter for screening for other viral diseases: Secondary | ICD-10-CM

## 2020-12-01 DIAGNOSIS — E785 Hyperlipidemia, unspecified: Secondary | ICD-10-CM

## 2020-12-01 DIAGNOSIS — Z0000 Encounter for general adult medical examination without abnormal findings: Secondary | ICD-10-CM

## 2020-12-01 DIAGNOSIS — Z23 Encounter for immunization: Secondary | ICD-10-CM | POA: Diagnosis not present

## 2020-12-01 DIAGNOSIS — Z8349 Family history of other endocrine, nutritional and metabolic diseases: Secondary | ICD-10-CM | POA: Diagnosis not present

## 2020-12-01 DIAGNOSIS — Z1231 Encounter for screening mammogram for malignant neoplasm of breast: Secondary | ICD-10-CM

## 2020-12-01 DIAGNOSIS — E559 Vitamin D deficiency, unspecified: Secondary | ICD-10-CM

## 2020-12-01 NOTE — Patient Instructions (Signed)
Preventive Care 40-43 Years Old, Female Preventive care refers to lifestyle choices and visits with your health care provider that can promote health and wellness. This includes: A yearly physical exam. This is also called an annual wellness visit. Regular dental and eye exams. Immunizations. Screening for certain conditions. Healthy lifestyle choices, such as: Eating a healthy diet. Getting regular exercise. Not using drugs or products that contain nicotine and tobacco. Limiting alcohol use. What can I expect for my preventive care visit? Physical exam Your health care provider will check your: Height and weight. These may be used to calculate your BMI (body mass index). BMI is a measurement that tells if you are at a healthy weight. Heart rate and blood pressure. Body temperature. Skin for abnormal spots. Counseling Your health care provider may ask you questions about your: Past medical problems. Family's medical history. Alcohol, tobacco, and drug use. Emotional well-being. Home life and relationship well-being. Sexual activity. Diet, exercise, and sleep habits. Work and work environment. Access to firearms. Method of birth control. Menstrual cycle. Pregnancy history. What immunizations do I need? Vaccines are usually given at various ages, according to a schedule. Your health care provider will recommend vaccines for you based on your age, medical history, and lifestyle or other factors, such as travel or where you work. What tests do I need? Blood tests Lipid and cholesterol levels. These may be checked every 5 years, or more often if you are over 50 years old. Hepatitis C test. Hepatitis B test. Screening Lung cancer screening. You may have this screening every year starting at age 55 if you have a 30-pack-year history of smoking and currently smoke or have quit within the past 15 years. Colorectal cancer screening. All adults should have this screening starting at  age 50 and continuing until age 75. Your health care provider may recommend screening at age 45 if you are at increased risk. You will have tests every 1-10 years, depending on your results and the type of screening test. Diabetes screening. This is done by checking your blood sugar (glucose) after you have not eaten for a while (fasting). You may have this done every 1-3 years. Mammogram. This may be done every 1-2 years. Talk with your health care provider about when you should start having regular mammograms. This may depend on whether you have a family history of breast cancer. BRCA-related cancer screening. This may be done if you have a family history of breast, ovarian, tubal, or peritoneal cancers. Pelvic exam and Pap test. This may be done every 3 years starting at age 21. Starting at age 30, this may be done every 5 years if you have a Pap test in combination with an HPV test. Other tests STD (sexually transmitted disease) testing, if you are at risk. Bone density scan. This is done to screen for osteoporosis. You may have this scan if you are at high risk for osteoporosis. Talk with your health care provider about your test results, treatment options, and if necessary, the need for more tests. Follow these instructions at home: Eating and drinking  Eat a diet that includes fresh fruits and vegetables, whole grains, lean protein, and low-fat dairy products. Take vitamin and mineral supplements as recommended by your health care provider. Do not drink alcohol if: Your health care provider tells you not to drink. You are pregnant, may be pregnant, or are planning to become pregnant. If you drink alcohol: Limit how much you have to 0-1 drink a day. Be   aware of how much alcohol is in your drink. In the [place] one drink equals one 12 oz bottle of beer (355 mL), one 5 oz glass of wine (148 mL), or one 1 oz glass of hard liquor (44 mL). Lifestyle Take daily care of your teeth and  gums. Brush your teeth every morning and night with fluoride toothpaste. Floss one time each day. Stay active. Exercise for at least 30 minutes 5 or more days each week. Do not use any products that contain nicotine or tobacco, such as cigarettes, e-cigarettes, and chewing tobacco. If you need help quitting, ask your health care provider. Do not use drugs. If you are sexually active, practice safe sex. Use a condom or other form of protection to prevent STIs (sexually transmitted infections). If you do not wish to become pregnant, use a form of birth control. If you plan to become pregnant, see your health care provider for a prepregnancy visit. If told by your health care provider, take low-dose aspirin daily starting at age 63. Find healthy ways to cope with stress, such as: Meditation, yoga, or listening to music. Journaling. Talking to a trusted person. Spending time with friends and family. Safety Always wear your seat belt while driving or riding in a vehicle. Do not drive: If you have been drinking alcohol. Do not ride with someone who has been drinking. When you are tired or distracted. While texting. Wear a helmet and other protective equipment during sports activities. If you have firearms in your house, make sure you follow all gun safety procedures. What's next? Visit your health care provider once a year for an annual wellness visit. Ask your health care provider how often you should have your eyes and teeth checked. Stay up to date on all vaccines. This information is not intended to replace advice given to you by your health care provider. Make sure you discuss any questions you have with your health care provider. Document Revised: 05/22/2020 Document Reviewed: 11/23/2017 Elsevier Patient Education  2022 [employer].

## 2020-12-01 NOTE — Progress Notes (Signed)
HPI: Joanna Hayes is a 45 y.o. female who  has a past medical history of Normocytic anemia due to blood loss (04/27/2018) and Obesity.  she presents to [facility] today, 12/01/20,  for chief complaint of: Annual physical exam  Dentist: Last in July, every 6 months, no concerns Eye exam: Done last December, needs new referral to eye doctor in her insurance plan, wears glasses but no contact Exercise: Walks at least 2 to 3 miles every day and does cardio/strength training 4-5 times weekly Diet: Well-balanced diet, no special restrictions Pap smear: Normal in 2021, sees OB/GYN Mammogram: Completed September 2021 Colon cancer screening: Started early, completed 2021 COVID vaccine: Done, + booster  Concerns: None  Past medical, surgical, social and family history reviewed:  Patient Active Problem List   Diagnosis Date Noted   IUD check up 11/21/2018   Normocytic anemia due to blood loss 04/27/2018   Family history of thyroid disease 04/23/2018   Class 1 obesity due to excess calories with body mass index (BMI) of 34.0 to 34.9 in adult 04/23/2018   Encounter for annual routine gynecological examination 04/23/2018   Vitamin D insufficiency 04/12/2016   Obesity (BMI 30.0-34.9) 04/11/2016   Intermittent low back pain 04/11/2016   Dyslipidemia 10/16/2013    Past Surgical History:  Procedure Laterality Date   COLONOSCOPY  03/12/2020   NO PAST SURGERIES     WISDOM TOOTH EXTRACTION      Social History   Tobacco Use   Smoking status: Never   Smokeless tobacco: Never  Substance Use Topics   Alcohol use: Yes    Comment: Occ     Family History  Problem Relation Age of Onset   Hypertension Mother    Hypertension Father    Prostate cancer Father    Colonic polyp Father    Diabetes Maternal Grandfather    Colon cancer Paternal Grandfather    AAA (abdominal aortic aneurysm) Paternal Grandfather    Breast cancer Maternal Uncle    Breast cancer  Paternal Aunt    Thyroid disease Brother    Thyroid disease Sister    Esophageal cancer Neg Hx    Stomach cancer Neg Hx    Rectal cancer Neg Hx      Current medication list and allergy/intolerance information reviewed:    Current Outpatient Medications  Medication Sig Dispense Refill   Ascorbic Acid (VITAMIN C PO) Take by mouth.     cetirizine (ZYRTEC) 10 MG chewable tablet Chew 10 mg by mouth daily.     cholecalciferol (VITAMIN D) 1000 units tablet Take 1,000 Units by mouth daily.     COVID-19 mRNA vaccine, Pfizer, 30 MCG/0.3ML injection INJECT AS DIRECTED .3 mL 0   ELDERBERRY PO Take by mouth daily.     levonorgestrel (LILETTA) 19.5 MCG/DAY IUD IUD 1 each by Intrauterine route once.     No current facility-administered medications for this visit.    No Known Allergies    Review of Systems: Constitutional:  No  fever, no chills, No recent illness, No unintentional weight changes. No significant fatigue.  HEENT: No  headache, no vision change, no hearing change, No sore throat, No  sinus pressure Cardiac: No  chest pain, No  pressure, No palpitations, No  Orthopnea Respiratory:  No  shortness of breath. No  Cough Gastrointestinal: No  abdominal pain, No  nausea, No  vomiting,  No  blood in stool, No  diarrhea, No  constipation  Musculoskeletal: No new myalgia/arthralgia Skin:  No  Rash, No other wounds/concerning lesions Genitourinary: No  incontinence, No  abnormal genital bleeding, No abnormal genital discharge Hem/Onc: No  easy bruising/bleeding, No  abnormal lymph node Endocrine: No cold intolerance,  No heat intolerance. No polyuria/polydipsia/polyphagia  Neurologic: No  weakness, No  dizziness, No  slurred speech/focal weakness/facial droop Psychiatric: No  concerns with depression, No  concerns with anxiety, No sleep problems, No mood problems  Exam:  BP 100/66 (BP Location: Left Arm, Patient Position: Sitting, Cuff Size: Large)   Pulse 76   Resp 20   Ht _0  (1.6 m)    Wt 211 lb (95.7 kg)   SpO2 99%   BMI 37.38 kg/m  Constitutional: VS see above. General Appearance: alert, well-developed, well-nourished, NAD Eyes: Normal lids and conjunctive, non-icteric sclera Ears, Nose, Mouth, Throat: MMM, Normal external inspection ears/nares/mouth/lips/gums. TM normal bilaterally.   Neck: No masses, trachea midline. No thyroid enlargement. No tenderness/mass appreciated. No lymphadenopathy Respiratory: Normal respiratory effort. no wheeze, no rhonchi, no rales Cardiovascular: S1/S2 normal, no murmur, no rub/gallop auscultated. RRR. No lower extremity edema. Pedal pulse II/IV bilaterally DP and PT. No carotid bruit or JVD. No abdominal aortic bruit. Gastrointestinal: Nontender, no masses. No hepatomegaly, no splenomegaly. No hernia appreciated. Bowel sounds normal. Rectal exam deferred.  Musculoskeletal: Gait normal. No clubbing/cyanosis of digits.  Neurological: Normal balance/coordination. No tremor. No cranial nerve deficit on limited exam. Motor and sensation intact and symmetric. Cerebellar reflexes intact.  Skin: warm, dry, intact. No rash/ulcer. No concerning nevi or subq nodules on limited exam.   Psychiatric: Normal judgment/insight. Normal mood and affect. Oriented x3.    ASSESSMENT/PLAN:   1. Annual physical exam Checking CBC with differential, CMP, and lipid panel today. - CBC with Differential/Platelet - COMPLETE METABOLIC PANEL WITH GFR - Lipid panel  2. Vitamin D insufficiency Checking vitamin D level today - VITAMIN D 25 Hydroxy (Vit-D Deficiency, Fractures)  3. Family history of thyroid disease Checking TSH. - TSH  4. Dyslipidemia Checking lipid panel today. - Lipid panel  5. Encounter for screening mammogram for malignant neoplasm of breast Managed by OB/GYN, already scheduled.  6. Need for hepatitis C screening test Discuss screening recommendations.  Adding to blood work today - Hepatitis C Antibody  7. Diabetes mellitus  screening Checking hemoglobin A1c today. - Hemoglobin A1c  8. Need for Tdap vaccination Tdap given in office today. - Tdap vaccine greater than or equal to 7yo IM   Orders Placed This Encounter  Procedures   Tdap vaccine greater than or equal to 7yo IM   CBC with Differential/Platelet   COMPLETE METABOLIC PANEL WITH GFR   Lipid panel   TSH   Hemoglobin A1c   VITAMIN D 25 Hydroxy (Vit-D Deficiency, Fractures)   Hepatitis C Antibody    No orders of the defined types were placed in this encounter.   Patient Instructions  Preventive Care 25-18 Years Old, Female Preventive care refers to lifestyle choices and visits with your health care provider that can promote health and wellness. This includes: A yearly physical exam. This is also called an annual wellness visit. Regular dental and eye exams. Immunizations. Screening for certain conditions. Healthy lifestyle choices, such as: Eating a healthy diet. Getting regular exercise. Not using drugs or products that contain nicotine and tobacco. Limiting alcohol use. What can I expect for my preventive care visit? Physical exam Your health care provider will check your: Height and weight. These may be used to calculate your BMI (body mass index). BMI  is a measurement that tells if you are at a healthy weight. Heart rate and blood pressure. Body temperature. Skin for abnormal spots. Counseling Your health care provider may ask you questions about your: Past medical problems. Family's medical history. Alcohol, tobacco, and drug use. Emotional well-being. Home life and relationship well-being. Sexual activity. Diet, exercise, and sleep habits. Work and work Statistician. Access to firearms. Method of birth control. Menstrual cycle. Pregnancy history. What immunizations do I need? Vaccines are usually given at various ages, according to a schedule. Your health care provider will recommend vaccines for you based on your age,  medical history, and lifestyle or other factors, such as travel or where you work. What tests do I need? Blood tests Lipid and cholesterol levels. These may be checked every 5 years, or more often if you are over 37 years old. Hepatitis C test. Hepatitis B test. Screening Lung cancer screening. You may have this screening every year starting at age 73 if you have a 30-pack-year history of smoking and currently smoke or have quit within the past 15 years. Colorectal cancer screening. All adults should have this screening starting at age 51 and continuing until age 74. Your health care provider may recommend screening at age 24 if you are at increased risk. You will have tests every 1-10 years, depending on your results and the type of screening test. Diabetes screening. This is done by checking your blood sugar (glucose) after you have not eaten for a while (fasting). You may have this done every 1-3 years. Mammogram. This may be done every 1-2 years. Talk with your health care provider about when you should start having regular mammograms. This may depend on whether you have a family history of breast cancer. BRCA-related cancer screening. This may be done if you have a family history of breast, ovarian, tubal, or peritoneal cancers. Pelvic exam and Pap test. This may be done every 3 years starting at age 71. Starting at age 52, this may be done every 5 years if you have a Pap test in combination with an HPV test. Other tests STD (sexually transmitted disease) testing, if you are at risk. Bone density scan. This is done to screen for osteoporosis. You may have this scan if you are at high risk for osteoporosis. Talk with your health care provider about your test results, treatment options, and if necessary, the need for more tests. Follow these instructions at home: Eating and drinking  Eat a diet that includes fresh fruits and vegetables, whole grains, lean protein, and low-fat dairy  products. Take vitamin and mineral supplements as recommended by your health care provider. Do not drink alcohol if: Your health care provider tells you not to drink. You are pregnant, may be pregnant, or are planning to become pregnant. If you drink alcohol: Limit how much you have to 0-1 drink a day. Be aware of how much alcohol is in your drink. In the [place] one drink equals one 12 oz bottle of beer (355 mL), one 5 oz glass of wine (148 mL), or one 1 oz glass of hard liquor (44 mL). Lifestyle Take daily care of your teeth and gums. Brush your teeth every morning and night with fluoride toothpaste. Floss one time each day. Stay active. Exercise for at least 30 minutes 5 or more days each week. Do not use any products that contain nicotine or tobacco, such as cigarettes, e-cigarettes, and chewing tobacco. If you need help quitting, ask your health care provider.  Do not use drugs. If you are sexually active, practice safe sex. Use a condom or other form of protection to prevent STIs (sexually transmitted infections). If you do not wish to become pregnant, use a form of birth control. If you plan to become pregnant, see your health care provider for a prepregnancy visit. If told by your health care provider, take low-dose aspirin daily starting at age 44. Find healthy ways to cope with stress, such as: Meditation, yoga, or listening to music. Journaling. Talking to a trusted person. Spending time with friends and family. Safety Always wear your seat belt while driving or riding in a vehicle. Do not drive: If you have been drinking alcohol. Do not ride with someone who has been drinking. When you are tired or distracted. While texting. Wear a helmet and other protective equipment during sports activities. If you have firearms in your house, make sure you follow all gun safety procedures. What's next? Visit your health care provider once a year for an annual wellness visit. Ask your  health care provider how often you should have your eyes and teeth checked. Stay up to date on all vaccines. This information is not intended to replace advice given to you by your health care provider. Make sure you discuss any questions you have with your health care provider. Document Revised: 05/22/2020 Document Reviewed: 11/23/2017 Elsevier Patient Education  [place].  Follow-up plan: Return in about 1 year (around 12/01/2021) for annual physical exam or sooner if needed.  Clearnce Sorrel, DNP, APRN, FNP-BC [facility]

## 2020-12-02 LAB — CBC WITH DIFFERENTIAL/PLATELET
Absolute Monocytes: 446 cells/uL (ref 200–950)
Basophils Absolute: 39 cells/uL (ref 0–200)
Basophils Relative: 0.8 %
Eosinophils Absolute: 240 cells/uL (ref 15–500)
Eosinophils Relative: 4.9 %
HCT: 37.2 % (ref 35.0–45.0)
Hemoglobin: 12.1 g/dL (ref 11.7–15.5)
Lymphs Abs: 1779 cells/uL (ref 850–3900)
MCH: 30.8 pg (ref 27.0–33.0)
MCHC: 32.5 g/dL (ref 32.0–36.0)
MCV: 94.7 fL (ref 80.0–100.0)
MPV: 10.1 fL (ref 7.5–12.5)
Monocytes Relative: 9.1 %
Neutro Abs: 2396 cells/uL (ref 1500–7800)
Neutrophils Relative %: 48.9 %
Platelets: 279 10*3/uL (ref 140–400)
RBC: 3.93 10*6/uL (ref 3.80–5.10)
RDW: 12.1 % (ref 11.0–15.0)
Total Lymphocyte: 36.3 %
WBC: 4.9 10*3/uL (ref 3.8–10.8)

## 2020-12-02 LAB — HEPATITIS C ANTIBODY
Hepatitis C Ab: NONREACTIVE
SIGNAL TO CUT-OFF: 0.11 (ref <1.00)

## 2020-12-02 LAB — HEMOGLOBIN A1C
Hgb A1c MFr Bld: 5.2 % of total Hgb (ref <5.7)
Mean Plasma Glucose: 103 mg/dL
eAG (mmol/L): 5.7 mmol/L

## 2020-12-02 LAB — COMPLETE METABOLIC PANEL WITH GFR
AG Ratio: 1.5 (calc) (ref 1.0–2.5)
ALT: 9 U/L (ref 6–29)
AST: 17 U/L (ref 10–30)
Albumin: 4 g/dL (ref 3.6–5.1)
Alkaline phosphatase (APISO): 71 U/L (ref 31–125)
BUN: 16 mg/dL (ref 7–25)
CO2: 26 mmol/L (ref 20–32)
Calcium: 8.9 mg/dL (ref 8.6–10.2)
Chloride: 107 mmol/L (ref 98–110)
Creat: 0.94 mg/dL (ref 0.50–0.99)
Globulin: 2.7 g/dL (calc) (ref 1.9–3.7)
Glucose, Bld: 85 mg/dL (ref 65–99)
Potassium: 4 mmol/L (ref 3.5–5.3)
Sodium: 139 mmol/L (ref 135–146)
Total Bilirubin: 0.4 mg/dL (ref 0.2–1.2)
Total Protein: 6.7 g/dL (ref 6.1–8.1)
eGFR: 77 mL/min/{1.73_m2} (ref >=60)

## 2020-12-02 LAB — LIPID PANEL
Cholesterol: 178 mg/dL (ref <200)
HDL: 43 mg/dL — ABNORMAL LOW (ref >=50)
LDL Cholesterol (Calc): 120 mg/dL (calc) — ABNORMAL HIGH
Non-HDL Cholesterol (Calc): 135 mg/dL (calc) — ABNORMAL HIGH (ref <130)
Total CHOL/HDL Ratio: 4.1 (calc) (ref <5.0)
Triglycerides: 55 mg/dL (ref <150)

## 2020-12-02 LAB — TSH: TSH: 1.29 mIU/L

## 2020-12-02 LAB — VITAMIN D 25 HYDROXY (VIT D DEFICIENCY, FRACTURES): Vit D, 25-Hydroxy: 47 ng/mL (ref 30–100)

## 2020-12-30 ENCOUNTER — Other Ambulatory Visit (HOSPITAL_COMMUNITY): Payer: Self-pay

## 2020-12-31 ENCOUNTER — Other Ambulatory Visit: Payer: Self-pay

## 2020-12-31 ENCOUNTER — Ambulatory Visit (INDEPENDENT_AMBULATORY_CARE_PROVIDER_SITE_OTHER): Payer: No Typology Code available for payment source

## 2020-12-31 DIAGNOSIS — Z1231 Encounter for screening mammogram for malignant neoplasm of breast: Secondary | ICD-10-CM | POA: Diagnosis not present

## 2021-01-05 ENCOUNTER — Other Ambulatory Visit: Payer: Self-pay

## 2021-01-05 ENCOUNTER — Encounter: Payer: Self-pay | Admitting: Advanced Practice Midwife

## 2021-01-05 ENCOUNTER — Ambulatory Visit (INDEPENDENT_AMBULATORY_CARE_PROVIDER_SITE_OTHER): Payer: No Typology Code available for payment source | Admitting: Advanced Practice Midwife

## 2021-01-05 VITALS — BP 109/69 | HR 91 | Resp 16 | Ht 63.0 in | Wt 213.0 lb

## 2021-01-05 DIAGNOSIS — Z01419 Encounter for gynecological examination (general) (routine) without abnormal findings: Secondary | ICD-10-CM

## 2021-01-05 NOTE — Progress Notes (Signed)
   Subjective:     Joanna Hayes is a 45 y.o. female here at [facility] for a routine exam.  Current complaints: none.  Personal health questionnaire reviewed: yes.  Do you have a primary care provider? yes Do you feel safe at home? yes  From PCP visit on 12/01/20: Dentist: Last in July, every 6 months, no concerns Eye exam: Done last December, needs new referral to eye doctor in her insurance plan, wears glasses but no contact Exercise: Walks at least 2 to 3 miles every day and does cardio/strength training 4-5 times weekly Diet: Well-balanced diet, no special restrictions Pap smear: Normal in 2021, sees OB/GYN Mammogram: Completed September 2021 Colon cancer screening: Started early, completed 2021 COVID vaccine: Done, + booster  [place] Visit from 12/01/2020 in [place]  PHQ-2 Total Score 0       There are no preventive care reminders to display for this patient.   Risk factors for chronic health problems: Smoking: Alchohol/how much: Pt BMI: Body mass index is 37.73 kg/m.   Gynecologic History No LMP recorded. (Menstrual status: IUD). Contraception: IUD Last Pap: 2021. Results were: normal with negative hpv Last mammogram: 2022. Results were: normal  Obstetric History OB History  Gravida Para Term Preterm AB Living  2 2       2   SAB IAB Ectopic Multiple Live Births               # Outcome Date GA Lbr Len/2nd Weight Sex Delivery Anes PTL Lv  2 Para      Vag-Spont     1 Para      Vag-Spont        The following portions of the patient's history were reviewed and updated as appropriate: allergies, current medications, past family history, past medical history, past social history, past surgical history, and problem list.  Review of Systems Pertinent items noted in HPI and remainder of comprehensive ROS otherwise negative.    Objective:   BP 109/69   Pulse 91   Resp 16   Ht 5\' 3"  (1.6 m)   Wt 213 lb (96.6  kg)   BMI 37.73 kg/m  VS reviewed, nursing note reviewed,  Constitutional: well developed, well nourished, no distress HEENT: normocephalic CV: normal rate Pulm/chest wall: normal effort Breast Exam:  exam performed: right breast normal without mass, skin or nipple changes or axillary nodes, left breast normal without mass, skin or nipple changes or axillary nodes Abdomen: soft Neuro: alert and oriented x 3 Skin: warm, dry Psych: affect normal Pelvic exam: Deferred with shared decision-making      Assessment/Plan:   1. Well woman exam with routine gynecological exam --Normal exam, no gyn concerns --Liletta IUD for contraception, placed 2020, no problems --Pap wnl with neg hpv in 2021, next Pap due in 2026 --Mammogram completed in 2022, normal   Follow up in: 1  year  or as needed.   Fatima Blank, CNM 8:29 AM

## 2021-04-09 ENCOUNTER — Other Ambulatory Visit (HOSPITAL_COMMUNITY): Payer: Self-pay

## 2021-11-17 ENCOUNTER — Other Ambulatory Visit: Payer: Self-pay | Admitting: Certified Nurse Midwife

## 2021-11-17 DIAGNOSIS — Z1231 Encounter for screening mammogram for malignant neoplasm of breast: Secondary | ICD-10-CM

## 2021-12-01 ENCOUNTER — Encounter: Payer: Self-pay | Admitting: Medical-Surgical

## 2021-12-01 ENCOUNTER — Ambulatory Visit (INDEPENDENT_AMBULATORY_CARE_PROVIDER_SITE_OTHER): Payer: No Typology Code available for payment source | Admitting: Medical-Surgical

## 2021-12-01 VITALS — BP 117/74 | HR 78 | Resp 20 | Ht 63.0 in | Wt 215.6 lb

## 2021-12-01 DIAGNOSIS — E559 Vitamin D deficiency, unspecified: Secondary | ICD-10-CM

## 2021-12-01 DIAGNOSIS — Z23 Encounter for immunization: Secondary | ICD-10-CM

## 2021-12-01 DIAGNOSIS — Z0000 Encounter for general adult medical examination without abnormal findings: Secondary | ICD-10-CM | POA: Diagnosis not present

## 2021-12-01 DIAGNOSIS — Z1231 Encounter for screening mammogram for malignant neoplasm of breast: Secondary | ICD-10-CM

## 2021-12-01 NOTE — Progress Notes (Signed)
Complete physical exam  Patient: Joanna Hayes   DOB: 18-May-1975   46 y.o. Female  MRN: [id number]  Subjective:    Chief Complaint  Patient presents with   Annual Exam    Joanna Hayes is a 46 y.o. female who presents today for a complete physical exam. She reports consuming a general diet.  Walking 1-3 miles four times per week, gym classes that incorporate strength and cardio.  She generally feels well. She reports sleeping well. She does not have additional problems to discuss today.    Most recent fall risk assessment:    12/01/2020    8:52 AM  Fall Risk   Falls in the past year? 0  Number falls in past yr: 0  Injury with Fall? 0  Risk for fall due to : No Fall Risks  Follow up Falls evaluation completed     Most recent depression screenings:    12/01/2021    8:47 AM 12/01/2020    8:51 AM  PHQ 2/9 Scores  PHQ - 2 Score 0 0    Vision:Within last year, Dental: No current dental problems and Receives regular dental care, and STD: The patient denies history of sexually transmitted disease.    Patient Care Team: Samuel Bouche, NP as PCP - General (Nurse Practitioner) Emily Filbert, MD as Consulting Physician (Obstetrics and Gynecology)   Outpatient Medications Prior to Visit  Medication Sig   Ascorbic Acid (VITAMIN C PO) Take by mouth.   cetirizine (ZYRTEC) 10 MG chewable tablet Chew 10 mg by mouth daily.   cholecalciferol (VITAMIN D) 1000 units tablet Take 1,000 Units by mouth daily.   ELDERBERRY PO Take by mouth daily.   levonorgestrel (LILETTA) 19.5 MCG/DAY IUD IUD 1 each by Intrauterine route once.   Omega-3 Fatty Acids (FISH OIL) 1000 MG CAPS Take 1 capsule by mouth.   No facility-administered medications prior to visit.    Review of Systems  Constitutional:  Negative for chills, fever, malaise/fatigue and weight loss.  HENT:  Negative for congestion, ear pain, hearing loss, sinus pain and sore throat.   Eyes:  Negative for blurred vision, photophobia and pain.   Respiratory:  Negative for cough, shortness of breath and wheezing.   Cardiovascular:  Positive for leg swelling (intermittent bilateral lower leg). Negative for chest pain and palpitations.  Gastrointestinal:  Negative for abdominal pain, constipation, diarrhea, heartburn, nausea and vomiting.  Genitourinary:  Negative for dysuria, frequency and urgency.  Musculoskeletal:  Negative for falls and neck pain.  Skin:  Negative for itching and rash.  Neurological:  Negative for dizziness, weakness and headaches.  Endo/Heme/Allergies:  Negative for polydipsia. Does not bruise/bleed easily.  Psychiatric/Behavioral:  Negative for depression, substance abuse and suicidal ideas. The patient is not nervous/anxious.      Objective:    BP 117/74   Pulse 78   Resp 20   Ht '5\' 3"'$  (1.6 m)   Wt 215 lb 9.6 oz (97.8 kg)   SpO2 97%   BMI 38.19 kg/m    Physical Exam Vitals reviewed.  Constitutional:      General: She is not in acute distress.    Appearance: Normal appearance. She is not ill-appearing.  HENT:     Head: Normocephalic and atraumatic.     Right Ear: Tympanic membrane, ear canal and external ear normal. There is no impacted cerumen.     Left Ear: Tympanic membrane, ear canal and external ear normal. There is no impacted cerumen.  Nose: Nose normal.     Mouth/Throat:     Mouth: Mucous membranes are moist.     Pharynx: No oropharyngeal exudate or posterior oropharyngeal erythema.  Eyes:     General: No scleral icterus.       Right eye: No discharge.        Left eye: No discharge.     Extraocular Movements: Extraocular movements intact.     Conjunctiva/sclera: Conjunctivae normal.     Pupils: Pupils are equal, round, and reactive to light.  Neck:     Thyroid: No thyromegaly.     Vascular: No carotid bruit or JVD.     Trachea: Trachea normal.  Cardiovascular:     Rate and Rhythm: Normal rate and regular rhythm.     Pulses: Normal pulses.     Heart sounds: Normal heart sounds.  No murmur heard.    No friction rub. No gallop.  Pulmonary:     Effort: Pulmonary effort is normal. No respiratory distress.     Breath sounds: Normal breath sounds. No wheezing.  Abdominal:     General: Bowel sounds are normal. There is no distension.     Palpations: Abdomen is soft.     Tenderness: There is no abdominal tenderness. There is no guarding.  Musculoskeletal:        General: Normal range of motion.     Cervical back: Normal range of motion and neck supple.  Lymphadenopathy:     Cervical: No cervical adenopathy.  Skin:    General: Skin is warm and dry.  Neurological:     Mental Status: She is alert and oriented to person, place, and time.     Cranial Nerves: No cranial nerve deficit.  Psychiatric:        Mood and Affect: Mood normal.        Behavior: Behavior normal.        Thought Content: Thought content normal.        Judgment: Judgment normal.    No results found for any visits on 12/01/21.     Assessment & Plan:    Routine Health Maintenance and Physical Exam  Immunization History  Administered Date(s) Administered   Influenza Whole 12/26/2012   Influenza-Unspecified 01/07/2019   PFIZER(Purple Top)SARS-COV-2 Vaccination 04/18/2019, 05/09/2019, 04/27/2020   Tdap 12/01/2020    Health Maintenance  Topic Date Due   COVID-19 Vaccine (4 - Pfizer series) 12/17/2021 (Originally 06/22/2020)   INFLUENZA VACCINE  06/26/2022 (Originally 10/26/2021)   PAP SMEAR-Modifier  11/25/2024   COLONOSCOPY (Pts 45-38yr Insurance coverage will need to be confirmed)  03/12/2025   TETANUS/TDAP  12/02/2030   Hepatitis C Screening  Completed   HIV Screening  Completed   HPV VACCINES  Aged Out    Discussed health benefits of physical activity, and encouraged her to engage in regular exercise appropriate for her age and condition.  1. Annual physical exam Checking labs as below.  Up-to-date on preventative care.  Wellness information provided with AVS. - Lipid panel -  COMPLETE METABOLIC PANEL WITH GFR - CBC with Differential/Platelet  2. Encounter for screening mammogram for malignant neoplasm of breast This is completed with OB/GYN.  She already has it scheduled.  3. Need for influenza vaccination Declined today.  Works for [employer] will have this done at work.  4. Vitamin D insufficiency Checking vitamin D today. - VITAMIN D 25 Hydroxy (Vit-D Deficiency, Fractures)  Return in about 1 year (around 12/02/2022) for annual physical exam.   JSamuel Bouche  NP

## 2021-12-02 LAB — CBC WITH DIFFERENTIAL/PLATELET
Absolute Monocytes: 495 cells/uL (ref 200–950)
Basophils Absolute: 51 cells/uL (ref 0–200)
Basophils Relative: 1 %
Eosinophils Absolute: 260 cells/uL (ref 15–500)
Eosinophils Relative: 5.1 %
HCT: 37.5 % (ref 35.0–45.0)
Hemoglobin: 12.3 g/dL (ref 11.7–15.5)
Lymphs Abs: 1795 cells/uL (ref 850–3900)
MCH: 30.8 pg (ref 27.0–33.0)
MCHC: 32.8 g/dL (ref 32.0–36.0)
MCV: 94 fL (ref 80.0–100.0)
MPV: 10.6 fL (ref 7.5–12.5)
Monocytes Relative: 9.7 %
Neutro Abs: 2499 cells/uL (ref 1500–7800)
Neutrophils Relative %: 49 %
Platelets: 304 10*3/uL (ref 140–400)
RBC: 3.99 10*6/uL (ref 3.80–5.10)
RDW: 11.9 % (ref 11.0–15.0)
Total Lymphocyte: 35.2 %
WBC: 5.1 10*3/uL (ref 3.8–10.8)

## 2021-12-02 LAB — LIPID PANEL
Cholesterol: 180 mg/dL (ref <200)
HDL: 44 mg/dL — ABNORMAL LOW (ref >=50)
LDL Cholesterol (Calc): 122 mg/dL (calc) — ABNORMAL HIGH
Non-HDL Cholesterol (Calc): 136 mg/dL (calc) — ABNORMAL HIGH (ref <130)
Total CHOL/HDL Ratio: 4.1 (calc) (ref <5.0)
Triglycerides: 57 mg/dL (ref <150)

## 2021-12-02 LAB — VITAMIN D 25 HYDROXY (VIT D DEFICIENCY, FRACTURES): Vit D, 25-Hydroxy: 33 ng/mL (ref 30–100)

## 2021-12-02 LAB — COMPLETE METABOLIC PANEL WITH GFR
AG Ratio: 1.4 (calc) (ref 1.0–2.5)
ALT: 10 U/L (ref 6–29)
AST: 17 U/L (ref 10–30)
Albumin: 3.8 g/dL (ref 3.6–5.1)
Alkaline phosphatase (APISO): 73 U/L (ref 31–125)
BUN: 10 mg/dL (ref 7–25)
CO2: 23 mmol/L (ref 20–32)
Calcium: 8.8 mg/dL (ref 8.6–10.2)
Chloride: 107 mmol/L (ref 98–110)
Creat: 0.89 mg/dL (ref 0.50–0.99)
Globulin: 2.7 g/dL (calc) (ref 1.9–3.7)
Glucose, Bld: 85 mg/dL (ref 65–99)
Potassium: 3.8 mmol/L (ref 3.5–5.3)
Sodium: 139 mmol/L (ref 135–146)
Total Bilirubin: 0.5 mg/dL (ref 0.2–1.2)
Total Protein: 6.5 g/dL (ref 6.1–8.1)
eGFR: 82 mL/min/{1.73_m2} (ref >=60)

## 2022-01-05 ENCOUNTER — Ambulatory Visit (INDEPENDENT_AMBULATORY_CARE_PROVIDER_SITE_OTHER): Payer: No Typology Code available for payment source

## 2022-01-05 DIAGNOSIS — Z1231 Encounter for screening mammogram for malignant neoplasm of breast: Secondary | ICD-10-CM | POA: Diagnosis not present

## 2022-01-06 ENCOUNTER — Ambulatory Visit: Payer: No Typology Code available for payment source

## 2022-01-11 ENCOUNTER — Ambulatory Visit (INDEPENDENT_AMBULATORY_CARE_PROVIDER_SITE_OTHER): Payer: No Typology Code available for payment source

## 2022-01-11 VITALS — BP 117/74 | HR 86 | Ht 63.0 in | Wt 207.0 lb

## 2022-01-11 DIAGNOSIS — Z01419 Encounter for gynecological examination (general) (routine) without abnormal findings: Secondary | ICD-10-CM

## 2022-01-11 DIAGNOSIS — Z0000 Encounter for general adult medical examination without abnormal findings: Secondary | ICD-10-CM

## 2022-01-11 NOTE — Progress Notes (Signed)
Last mammogram 01/05/22- normal Last pap 11/26/19- negative

## 2022-01-11 NOTE — Progress Notes (Signed)
   Subjective:     Joanna Hayes is a 46 y.o. female here at [facility] for a routine exam.  Current complaints: none.  Personal health questionnaire reviewed: yes.  Do you have a primary care provider? Yes, last saw 12/01/2021   [place] Office Visit from 12/01/2021 in [place]  PHQ-2 Total Score 0       Health Maintenance Due  Topic Date Due   COVID-19 Vaccine (4 - Pfizer series) 06/22/2020    Risk factors for chronic health problems:  Smoking: Denies Alchohol/how much: Occasional use- last had several months ago Illicit drug use: Denies Exercise: Walks daily 1.5-3 miles, goes to [facility] 4-5x/week for cardio/strength training classes Pt BMI: Body mass index is 36.67 kg/m.   Gynecologic History Patient's last menstrual period was 12/16/2021 (approximate). Contraception: IUD Sexual health: 1 female partner, no issues Last Pap: 11/26/2019. Results were: NILM, neg HRHPV Last mammogram: 01/05/2022. Results were: normal  Obstetric History OB History  Gravida Para Term Preterm AB Living  '2 2       2  '$ SAB IAB Ectopic Multiple Live Births               # Outcome Date GA Lbr Len/2nd Weight Sex Delivery Anes PTL Lv  2 Para      Vag-Spont     1 Para      Vag-Spont       The following portions of the patient's history were reviewed and updated as appropriate: allergies, current medications, past family history, past medical history, past social history, past surgical history, and problem list.  Review of Systems Pertinent items are noted in HPI.    Objective:   BP 117/74   Pulse 86   Ht '5\' 3"'$  (1.6 m)   Wt 207 lb (93.9 kg)   LMP 12/16/2021 (Approximate)   BMI 36.67 kg/m  VS reviewed, nursing note reviewed,  Constitutional: well developed, well nourished, no distress HEENT: normocephalic, thyroid not enlarged CV: normal rate and rhythm Pulm/chest wall: normal effort, lung sounds clear bilaterally Breast Exam: right breast normal without  mass, skin or nipple changes or axillary nodes, left breast normal without mass, skin or nipple changes or axillary nodes Abdomen: soft, non-tender Neuro: alert and oriented x 3 Skin: warm, dry Psych: affect normal Pelvic exam: Deferred  Assessment/Plan:  1. Well woman exam (no gynecological exam) - Pap smear up to date - Mammogram up to date - Declines STI screening - Flu shot last week   Return in about 1 year (around 01/12/2023) for well woman visit or sooner prn.   Renee Harder, CNM 8:20 AM

## 2022-05-02 ENCOUNTER — Encounter: Payer: Self-pay | Admitting: Medical-Surgical

## 2022-05-04 ENCOUNTER — Other Ambulatory Visit (HOSPITAL_BASED_OUTPATIENT_CLINIC_OR_DEPARTMENT_OTHER): Payer: Self-pay

## 2022-05-04 ENCOUNTER — Ambulatory Visit (INDEPENDENT_AMBULATORY_CARE_PROVIDER_SITE_OTHER): Payer: 59

## 2022-05-04 ENCOUNTER — Ambulatory Visit (INDEPENDENT_AMBULATORY_CARE_PROVIDER_SITE_OTHER): Payer: 59 | Admitting: Sports Medicine

## 2022-05-04 DIAGNOSIS — M7541 Impingement syndrome of right shoulder: Secondary | ICD-10-CM | POA: Insufficient documentation

## 2022-05-04 DIAGNOSIS — Z09 Encounter for follow-up examination after completed treatment for conditions other than malignant neoplasm: Secondary | ICD-10-CM

## 2022-05-04 DIAGNOSIS — M2241 Chondromalacia patellae, right knee: Secondary | ICD-10-CM

## 2022-05-04 DIAGNOSIS — Z0389 Encounter for observation for other suspected diseases and conditions ruled out: Secondary | ICD-10-CM | POA: Diagnosis not present

## 2022-05-04 DIAGNOSIS — M25561 Pain in right knee: Secondary | ICD-10-CM | POA: Diagnosis not present

## 2022-05-04 DIAGNOSIS — M25511 Pain in right shoulder: Secondary | ICD-10-CM | POA: Diagnosis not present

## 2022-05-04 MED ORDER — MELOXICAM 15 MG PO TABS
ORAL_TABLET | ORAL | 3 refills | Status: DC
  Filled 2022-05-04: qty 30, 30d supply, fill #0

## 2022-05-04 NOTE — Progress Notes (Signed)
    Procedures performed today:    None.  Independent interpretation of notes and tests performed by another provider:   None.  Brief History, Exam, Impression, and Recommendations:    Impingement syndrome, shoulder, right Pleasant 47 year old female, she has been doing some working out, unfortunately she is developing pain over the deltoid worse with abduction. On exam she has positive Neer's, Hawkins, empty can signs without weakness, no labral signs. Suspect shoulder bursitis, we discussed the anatomy and pathophysiology, adding meloxicam, x-rays, formal physical therapy. Return to see me in 6 weeks.  Chondromalacia of patellofemoral joint, right Also noting increasing pain, grinding under the kneecap, worse with squatting, going up and down stairs. On exam she does have significant patellar crepitus with patellar compression. We discussed avoiding deep knee bending past 90 degrees in the gym, particular with squats, leg press and lunges. She will do meloxicam as above, adding aggressive physical therapy, x-rays, return to see me in 6 weeks, injection if not better.    ____________________________________________ Gwen Her. Dianah Field, M.D., ABFM., CAQSM., AME. Primary Care and Sports Medicine [facility]  Adjunct Professor of [employer]  Risk manager

## 2022-05-04 NOTE — Assessment & Plan Note (Signed)
Pleasant 48 year old female, she has been doing some working out, unfortunately she is developing pain over the deltoid worse with abduction. On exam she has positive Neer's, Hawkins, empty can signs without weakness, no labral signs. Suspect shoulder bursitis, we discussed the anatomy and pathophysiology, adding meloxicam, x-rays, formal physical therapy. Return to see me in 6 weeks.

## 2022-05-04 NOTE — Assessment & Plan Note (Signed)
Also noting increasing pain, grinding under the kneecap, worse with squatting, going up and down stairs. On exam she does have significant patellar crepitus with patellar compression. We discussed avoiding deep knee bending past 90 degrees in the gym, particular with squats, leg press and lunges. She will do meloxicam as above, adding aggressive physical therapy, x-rays, return to see me in 6 weeks, injection if not better.

## 2022-05-09 NOTE — Therapy (Unsigned)
OUTPATIENT PHYSICAL THERAPY SHOULDER EVALUATION   Patient Name: Joanna Hayes MRN: [id number] DOB:1975/10/28, 47 y.o., female Today's Date: 05/10/2022  END OF SESSION:  PT End of Session - 05/10/22 0921     Visit Number 1    Number of Visits 16    Date for PT Re-Evaluation 07/05/22    Authorization Type [facility] $40 copay    PT Start Time 0802    PT Stop Time 0900    PT Time Calculation (min) 58 min    Activity Tolerance Patient tolerated treatment well             Past Medical History:  Diagnosis Date   Normocytic anemia due to blood loss 04/27/2018   Obesity    Past Surgical History:  Procedure Laterality Date   COLONOSCOPY  03/12/2020   NO PAST SURGERIES     WISDOM TOOTH EXTRACTION     Patient Active Problem List   Diagnosis Date Noted   Impingement syndrome, shoulder, right 05/04/2022   Chondromalacia of patellofemoral joint, right 05/04/2022   Normocytic anemia due to blood loss 04/27/2018   Family history of thyroid disease 04/23/2018   Vitamin D insufficiency 04/12/2016   Intermittent low back pain 04/11/2016   Dyslipidemia 10/16/2013    PCP: Samuel Bouche, NP  REFERRING PROVIDER: Dr Aundria Mems  REFERRING DIAG: impingement Rt shoulder; patellofemoral chondromalacia Rt  THERAPY DIAG:  Shoulder impingement syndrome, right  Other symptoms and signs involving the musculoskeletal system  Abnormal posture  Chondromalacia of patellofemoral joint, right  Muscle weakness (generalized)  Rationale for Evaluation and Treatment: Rehabilitation  ONSET DATE: 04/26/22 shoulder; ~03/28/21 knee   SUBJECTIVE:                                                                                                                                                                                      SUBJECTIVE STATEMENT: Patient reports onset of Rt shoulder pain ~ 2 weeks ago with no known injury. She noticed pain in the Rt shoulder after exercise class and pain has  persisted for the past 2 weeks. Pain is improving some in the past week but still painful with elevating Rt shoulder to side with rotation.  She has Rt knee pain with certain activities including squats, ascending and descending stairs. She can hear crepitus in the knee more recently. Symptoms have been noticeable for the past 1-2 years. Now wearing a brace when she is in the gym.  PERTINENT HISTORY:  History of Rt shoulder pain ~ 10 years ago which resolved with rest. Denies any medical problems.   PAIN:  Are you having pain? Yes: NPRS scale: 0/10 shoulder or knee  Pain location: Rt shoulder; Rt knee  Pain description: shoulder sharp; knee dull, clicking  Aggravating factors: shoulder abduction with rotation; knee stairs, squats, lunges  Relieving factors: avoiding those activities   PRECAUTIONS: None  WEIGHT BEARING RESTRICTIONS: No  FALLS:  Has patient fallen in last 6 months? 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 miles daily; household chores   PLOF: Independent  PATIENT GOALS:get rid of the pain in the shoulder and knee and keep the symptoms from returning   NEXT MD VISIT: 06/15/22  OBJECTIVE:   DIAGNOSTIC FINDINGS:  Xrays: 05/04/22 - bilat knees - No evidence of fracture, dislocation, or joint effusion. No evidence of arthropathy or other focal bone abnormality. Soft tissues are unremarkable. 05/04/22 - Rt shoulder - no evidence of fracture or dislocation; no evidence of arthropathy or other focal bone abnormality. Soft tissues are unremarkable.  PATIENT SURVEYS:  FOTO 52; goal 34  COGNITION: Overall cognitive status: Within functional limits for tasks  assessed     SENSATION: WFL  POSTURE: Patient presents with head forward posture with increased thoracic kyphosis; shoulders rounded and elevated; scapulae abducted and rotated along the thoracic spine; head of the humerus anterior in orientation.   UPPER EXTREMITY ROM: pain and tightness Rt ROM   Active ROM Right eval Left eval  Shoulder flexion 144 151  Shoulder extension 39 45  Shoulder abduction 140 154  Shoulder adduction    Shoulder internal rotation Thumb T10 Thumb T7  Shoulder external rotation 90 90  Elbow flexion    Elbow extension    Wrist flexion    Wrist extension    Wrist ulnar deviation    Wrist radial deviation    Wrist pronation    Wrist supination    (Blank rows = not tested) LE Strength:  LE strength grossly WFL's patient has muscular imbalance with lateral quads more activated than medial Rt LE   UPPER EXTREMITY MMT: WFL's bilat shoulders except as noted; pain with resisted shoulder abduction; rotation  MMT Right eval Left eval  Shoulder flexion    Shoulder extension    Shoulder abduction 5-/5 pain   Shoulder adduction    Shoulder internal rotation 5-/5 pain    Shoulder external rotation 4+/5 pain   Middle trapezius    Lower trapezius    Elbow flexion    Elbow extension    Wrist flexion    Wrist extension    Wrist ulnar deviation    Wrist radial deviation    Wrist pronation    Wrist supination    Grip strength (lbs)    (Blank rows = not tested)  LE ROM:  WFL's  Pecs/Biceps: tight Rt > Lt   PALPATION:  Shoulder: muscular tightness pecs/biceps; upper trap; leveator; teres Rt > Lt  Knee; tight with muscular banding Rt knee lateral quad insertion at superior lateral pole of patella    TODAY'S TREATMENT:  [facility] Adult PT Treatment:                                                DATE: 05/10/22 Therapeutic  Exercise: Chin tuck with noodle 5 sec x 5 Scap squeeze with noodle 10 sec x 5 L's with noodle x 10 W's with noodle x 10  Quad set with patellar taping 5 sec x 5 SLR in ER 3 sec x 10  Manual Therapy: Taping Rt knee to improve patellar alignment Rock tape  Deep tissue work- transverse friction massage Lateral Rt quad at patellar insertion ~ 1 min 2x/day Neuromuscular re-ed: Activation of medial quad Therapeutic Activity: Myofacial ball release work standing for Rt shoulder girdle  Gait: WFL's   Modalities:  Self Care: Avoid standing with knees hyperextended    PATIENT EDUCATION: Education details: POC; HEP  Person educated: Patient Education method: Explanation, Demonstration, Tactile cues, Verbal cues, and Handouts Education comprehension: verbalized understanding, returned demonstration, verbal cues required, tactile cues required, and needs further education  HOME EXERCISE PROGRAM: Access Code: [place] URL: [email or web address] Date: 05/10/2022 Prepared by: Gillermo Murdoch  Program Notes Avoid locking knees when standing   Exercises - Seated Cervical Retraction  - 3 x daily - 7 x weekly - 1 sets - 10 reps - Standing Scapular Retraction  - 3 x daily - 7 x weekly - 1 sets - 10 reps - 10 hold - Standing Scapular Retraction in Abduction  - 3 x daily - 7 x weekly - 1 sets - 10 reps - Shoulder External Rotation in 45 Degrees Abduction  - 2 x daily - 7 x weekly - 1-2 sets - 10 reps - 3 sec  hold - Doorway Pec Stretch at 60 Degrees Abduction  - 3 x daily - 7 x weekly - 1 sets - 3 reps - Doorway Pec Stretch at 90 Degrees Abduction  - 3 x daily - 7 x weekly - 1 sets - 3 reps - 30 seconds  hold - Doorway Pec Stretch at 120 Degrees Abduction  - 3 x daily - 7 x weekly - 1 sets - 3 reps - 30 second hold  hold - Supine Quad Set  - 2 x daily - 7 x weekly - 1 sets - 10 reps - 3 sec  hold - Straight Leg Raise with External Rotation  - 2 x daily - 7 x weekly - 1 sets - 10 reps  - 3-5 sec  hold  Patient Education - Office Posture  ASSESSMENT:  CLINICAL IMPRESSION: Patient is a 47 y.o. female who was seen today for physical therapy evaluation and treatment for Rt shoulder impingement and Rt patellofemoral chondromalacia. She has a history of Rt shoulder pain for the past 2 weeks with no known injury but does report similar episode of Rt shoulder pain ~ 10 years ago which resolved with rest. She has poor posture and alignment; limited Rt > Lt shoulder ROM; poor muscular balance; poor scapular control for shoulder elevation; muscular tightness with palpation; pain with functional activities. Patient reports pain in Rt knee for 1-2 years with no known injury. She stands with bilat knees in hyperextension; muscular tightness to palpation lateral Rt quad to insertion of quad at superior lateral patella; pain with patella grind; pain with ascending and descending steps; squats; lunges. Patient will benefit from PT to address problems identified.  OBJECTIVE IMPAIRMENTS: Abnormal gait, decreased activity tolerance, decreased balance, decreased mobility, decreased ROM, decreased strength, increased fascial restrictions, increased muscle spasms, impaired flexibility, impaired UE functional use, improper body mechanics, postural dysfunction, and pain.   ACTIVITY LIMITATIONS: carrying, lifting, standing, squatting, stairs, and reach over head  PARTICIPATION LIMITATIONS: cleaning, laundry, driving, shopping, and community activity  PERSONAL FACTORS: Fitness and Past/current experiences are also affecting patient's functional outcome.   REHAB POTENTIAL: Good  CLINICAL DECISION MAKING: Stable/uncomplicated  EVALUATION COMPLEXITY: Moderate   GOALS: Goals reviewed with patient? Yes  SHORT TERM GOALS: Target date: 06/07/2022   Independent in initial HEP  Baseline: Goal status: INITIAL  2.  Demonstration and verbalization of improved posture and alignment to decrease postural  infulcnces for shoulder pain  Baseline:  Goal status: INITIAL  3.  Avoid hyperextension of knees in standing  Baseline:  Goal status: INITIAL   LONG TERM GOALS: Target date: 07/05/2022  Decrease pain in Rt shoulder and Rt knee by 75-100% allowing patient to participate in all exercise and functional activities without pain  Baseline:  Goal status: INITIAL  2.  Increase strength Rt shoulder to 5/5 with no pain  Baseline:  Goal status: INITIAL  3.  Improve posture and alignment with patient to demonstrate improved activation of posterior shoulder girdle with functional use of Rt UE Baseline:  Goal status: INITIAL  4.  Patient to tolerate ascending and descending steps; squats and lunges in the gym with no Rt knee pain  Baseline:  Goal status: INITIAL  5.  Independent in HEP  Baseline:  Goal status: INITIAL  6.  Functional limitation score 74  Baseline:  Goal status: INITIAL  PLAN:  PT FREQUENCY: 2x/week  PT DURATION: 8 weeks  PLANNED INTERVENTIONS: Therapeutic exercises, Therapeutic activity, Neuromuscular re-education, Balance training, Gait training, Patient/Family education, Self Care, Joint mobilization, Stair training, Aquatic Therapy, Dry Needling, Electrical stimulation, Spinal mobilization, Cryotherapy, Moist heat, Taping, Vasopneumatic device, Ultrasound, Ionotophoresis 71m/ml Dexamethasone, Manual therapy, and Re-evaluation  PLAN FOR NEXT SESSION: review and progress exercises; continue with postural correction and education; manual work, DN, modalities as indicated.    CEverardo All PT 05/10/2022, 10:55 AM

## 2022-05-10 ENCOUNTER — Encounter: Payer: Self-pay | Admitting: Rehabilitative and Restorative Service Providers"

## 2022-05-10 ENCOUNTER — Ambulatory Visit: Payer: 59 | Attending: Sports Medicine | Admitting: Rehabilitative and Restorative Service Providers"

## 2022-05-10 ENCOUNTER — Other Ambulatory Visit: Payer: Self-pay

## 2022-05-10 DIAGNOSIS — M6281 Muscle weakness (generalized): Secondary | ICD-10-CM

## 2022-05-10 DIAGNOSIS — R293 Abnormal posture: Secondary | ICD-10-CM | POA: Diagnosis not present

## 2022-05-10 DIAGNOSIS — R29898 Other symptoms and signs involving the musculoskeletal system: Secondary | ICD-10-CM | POA: Diagnosis not present

## 2022-05-10 DIAGNOSIS — M2241 Chondromalacia patellae, right knee: Secondary | ICD-10-CM

## 2022-05-10 DIAGNOSIS — M7541 Impingement syndrome of right shoulder: Secondary | ICD-10-CM | POA: Diagnosis not present

## 2022-05-12 ENCOUNTER — Encounter: Payer: Self-pay | Admitting: Rehabilitative and Restorative Service Providers"

## 2022-05-12 ENCOUNTER — Ambulatory Visit: Payer: 59 | Admitting: Rehabilitative and Restorative Service Providers"

## 2022-05-12 DIAGNOSIS — M6281 Muscle weakness (generalized): Secondary | ICD-10-CM | POA: Diagnosis not present

## 2022-05-12 DIAGNOSIS — M2241 Chondromalacia patellae, right knee: Secondary | ICD-10-CM | POA: Diagnosis not present

## 2022-05-12 DIAGNOSIS — R293 Abnormal posture: Secondary | ICD-10-CM | POA: Diagnosis not present

## 2022-05-12 DIAGNOSIS — R29898 Other symptoms and signs involving the musculoskeletal system: Secondary | ICD-10-CM | POA: Diagnosis not present

## 2022-05-12 DIAGNOSIS — M7541 Impingement syndrome of right shoulder: Secondary | ICD-10-CM

## 2022-05-12 NOTE — Therapy (Signed)
OUTPATIENT PHYSICAL THERAPY SHOULDER AND KNEE TREATMENT    Patient Name: Joanna Hayes MRN: [id number] DOB:1975-08-25, 47 y.o., female Today's Date: 05/12/2022  END OF SESSION:  PT End of Session - 05/12/22 0803     Visit Number 2    Number of Visits 16    Date for PT Re-Evaluation 07/05/22    Authorization Type [facility] $40 copay    PT Start Time 0801    PT Stop Time 0849    PT Time Calculation (min) 48 min    Activity Tolerance Patient tolerated treatment well             Past Medical History:  Diagnosis Date   Normocytic anemia due to blood loss 04/27/2018   Obesity    Past Surgical History:  Procedure Laterality Date   COLONOSCOPY  03/12/2020   NO PAST SURGERIES     WISDOM TOOTH EXTRACTION     Patient Active Problem List   Diagnosis Date Noted   Impingement syndrome, shoulder, right 05/04/2022   Chondromalacia of patellofemoral joint, right 05/04/2022   Normocytic anemia due to blood loss 04/27/2018   Family history of thyroid disease 04/23/2018   Vitamin D insufficiency 04/12/2016   Intermittent low back pain 04/11/2016   Dyslipidemia 10/16/2013    PCP: Samuel Bouche, NP  REFERRING PROVIDER: Dr Aundria Mems  REFERRING DIAG: impingement Rt shoulder; patellofemoral chondromalacia Rt  THERAPY DIAG:  Shoulder impingement syndrome, right  Other symptoms and signs involving the musculoskeletal system  Abnormal posture  Chondromalacia of patellofemoral joint, right  Muscle weakness (generalized)  Rationale for Evaluation and Treatment: Rehabilitation  ONSET DATE: 04/26/22 shoulder; ~03/28/21 knee   SUBJECTIVE:                                                                                                                                                                                      SUBJECTIVE STATEMENT:  Patient reports no change in shoulder. She was able to go to exercise class and she felt less knee and more stability with the tape on Rt  knee. Working on exercises at home.   PERTINENT HISTORY:  Patient reports onset of Rt shoulder pain ~ 2 weeks ago with no known injury. She noticed pain in the Rt shoulder after exercise class and pain has persisted for the past 2 weeks. Pain is improving some in the past week but still painful with elevating Rt shoulder to side with rotation.  She has Rt knee pain with certain activities including squats, ascending and descending stairs. She can hear crepitus in the knee more recently. Symptoms have been noticeable for the past 1-2 years. Now wearing a brace when  she is in the gym.History of Rt shoulder pain ~ 10 years ago which resolved with rest. Denies any medical problems.   PAIN:  Are you having pain? Yes: NPRS scale: 0/10 shoulder or knee  Pain location: Rt shoulder; Rt knee  Pain description: shoulder sharp; knee dull, clicking  Aggravating factors: shoulder abduction with rotation; knee stairs, squats, lunges  Relieving factors: avoiding those activities   PRECAUTIONS: None  WEIGHT BEARING RESTRICTIONS: No  FALLS:  Has patient fallen in last 6 months? 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 miles daily; household chores    PATIENT GOALS:get rid of the pain in the shoulder and knee and keep the symptoms from returning   NEXT MD VISIT: 06/15/22  OBJECTIVE:   DIAGNOSTIC FINDINGS:  Xrays: 05/04/22 - bilat knees - No evidence of fracture, dislocation, or joint effusion. No evidence of arthropathy or other focal bone abnormality. Soft tissues are unremarkable. 05/04/22 - Rt shoulder - no evidence of fracture or dislocation; no evidence of arthropathy or other focal bone abnormality. Soft tissues are unremarkable.  PATIENT SURVEYS:  FOTO 52; goal 43   POSTURE: Patient presents with head forward posture with increased thoracic kyphosis; shoulders rounded and elevated; scapulae abducted  and rotated along the thoracic spine; head of the humerus anterior in orientation.   UPPER EXTREMITY ROM: pain and tightness Rt ROM   Active ROM Right eval Left eval  Shoulder flexion 144 151  Shoulder extension 39 45  Shoulder abduction 140 154  Shoulder adduction    Shoulder internal rotation Thumb T10 Thumb T7  Shoulder external rotation 90 90  Elbow flexion    Elbow extension    Wrist flexion    Wrist extension    Wrist ulnar deviation    Wrist radial deviation    Wrist pronation    Wrist supination    (Blank rows = not tested) LE Strength:  LE strength grossly WFL's patient has muscular imbalance with lateral quads more activated than medial Rt LE   UPPER EXTREMITY MMT: WFL's bilat shoulders except as noted; pain with resisted shoulder abduction; rotation  MMT Right eval Left eval  Shoulder flexion    Shoulder extension    Shoulder abduction 5-/5 pain   Shoulder adduction    Shoulder internal rotation 5-/5 pain    Shoulder external rotation 4+/5 pain   Middle trapezius    Lower trapezius    Elbow flexion    Elbow extension    Wrist flexion    Wrist extension    Wrist ulnar deviation    Wrist radial deviation    Wrist pronation    Wrist supination    Grip strength (lbs)    (Blank rows = not tested)  LE ROM:  WFL's  Pecs/Biceps: tight Rt > Lt   PALPATION:  Shoulder: muscular tightness pecs/biceps; upper trap; leveator; teres Rt > Lt  Knee; tight with muscular banding Rt knee lateral quad insertion at superior lateral pole of patella    TODAY'S TREATMENT:  [place] Adult PT Treatment:                                                 [facility] Adult PT Treatment:                                                DATE: 05/12/22 Therapeutic Exercise: Doorway stretch 3 positions 30 sec x 2 each  Chin tuck with noodle 5 sec x 5 Scap  squeeze with noodle 10 sec x 5 L's with noodle x 10 Scap squeeze yellow TB 3 sec x 10 x 2   W's with noodle x 10  W with noodle yellow B 3 sec x 10 x 2  Quad set with patellar taping 5 sec x 5 SLR in ER 3 sec x 10  Wall squat with ball btn knees 10 sec x 10  Manual Therapy: Taping Rt knee to improve patellar alignment Rock tape  Deep tissue work- transverse friction massage Lateral Rt quad at patellar insertion ~ 1 min 2x/day Neuromuscular re-ed: Activation of medial quad Therapeutic Activity: Myofacial ball release work standing for Rt shoulder girdle  Modalities:  Self Care: Avoid standing with knees hyperextended   DATE: 05/10/22 Therapeutic Exercise: Chin tuck with noodle 5 sec x 5 Scap squeeze with noodle 10 sec x 5 L's with noodle x 10 W's with noodle x 10  Quad set with patellar taping 5 sec x 5 SLR in ER 3 sec x 10  Manual Therapy: Taping Rt knee to improve patellar alignment Rock tape  Deep tissue work- transverse friction massage Lateral Rt quad at patellar insertion ~ 1 min 2x/day Neuromuscular re-ed: Activation of medial quad Therapeutic Activity: Myofacial ball release work standing for Rt shoulder girdle    PATIENT EDUCATION: Education details: POC; HEP  Person educated: Patient Education method: Consulting civil engineer, Media planner, Corporate treasurer cues, Verbal cues, and Handouts Education comprehension: verbalized understanding, returned demonstration, verbal cues required, tactile cues required, and needs further education  HOME EXERCISE PROGRAM: Access Code: [place] URL: [email or web address] Date: 05/12/2022 Prepared by: Gillermo Murdoch  Program Notes Avoid locking knees when standing   Exercises - Seated Cervical Retraction  - 3 x daily - 7 x weekly - 1 sets - 10 reps - Standing Scapular Retraction  - 3 x daily - 7 x weekly - 1 sets - 10 reps - 10 hold - Shoulder External Rotation and Scapular Retraction  - 3 x daily - 7 x weekly - 1 sets - 10 reps -  3-5 sec   hold - Shoulder External Rotation and Scapular Retraction with Resistance  - 2 x daily - 7 x weekly - 1 sets - 10 reps - 3-5 sec  hold - Shoulder External Rotation in 45 Degrees Abduction  - 2 x daily - 7 x weekly - 1-2 sets - 10 reps - 3 sec  hold - Doorway Pec Stretch at 60 Degrees Abduction  - 3 x daily - 7 x weekly - 1 sets - 3 reps - Shoulder W - External Rotation with Resistance  - 2 x daily - 7 x weekly - 1-2 sets - 10 reps - 3 sec  hold - Seated Cervical Sidebending AROM  - 2 x daily -  7 x weekly - 1 sets - 3-5 reps - 5-10 sec  hold - Standing Backward Shoulder Rolls  - 2 x daily - 7 x weekly - 1 sets - 10 reps - 1-2 sec  hold - Doorway Pec Stretch at 90 Degrees Abduction  - 3 x daily - 7 x weekly - 1 sets - 3 reps - 30 seconds  hold - Doorway Pec Stretch at 120 Degrees Abduction  - 3 x daily - 7 x weekly - 1 sets - 3 reps - 30 second hold  hold - Prone Quadriceps Stretch with Strap  - 2 x daily - 7 x weekly - 1 sets - 3 reps - 30 sec  hold - Supine Piriformis Stretch with Leg Straight  - 2 x daily - 7 x weekly - 1 sets - 3 reps - 30 sec  hold - Supine ITB Stretch with Strap  - 2 x daily - 7 x weekly - 1 sets - 3 reps - 30 sec  hold - Supine Quad Set  - 2 x daily - 7 x weekly - 1 sets - 10 reps - 3 sec  hold - Straight Leg Raise with External Rotation  - 2 x daily - 7 x weekly - 1 sets - 10 reps - 3-5 sec  hold - Wall Squat with Ball between Knees  - 2 x daily - 7 x weekly - 1-2 sets - 10 reps - 10 sec  hold  Patient Education - Office Posture  ASSESSMENT:  CLINICAL IMPRESSION: Patient reports no change in the shoulder pain. Knee feels more stable and less painful with tape. Reviewed and progressed with exercises.    OBJECTIVE IMPAIRMENTS: Rt shoulder pain for the past 2 weeks with no known injury but does report similar episode of Rt shoulder pain ~ 10 years ago which resolved with rest. She has poor posture and alignment; limited Rt > Lt shoulder ROM; poor muscular  balance; poor scapular control for shoulder elevation; muscular tightness with palpation; pain with functional activities. Patient reports pain in Rt knee for 1-2 years with no known injury. She stands with bilat knees in hyperextension; muscular tightness to palpation lateral Rt quad to insertion of quad at superior lateral patella; pain with patella grind; pain with ascending and descending steps; squats; lunges  GOALS: Goals reviewed with patient? Yes  SHORT TERM GOALS: Target date: 06/07/2022   Independent in initial HEP  Baseline: Goal status: INITIAL  2.  Demonstration and verbalization of improved posture and alignment to decrease postural infulcnces for shoulder pain  Baseline:  Goal status: INITIAL  3.  Avoid hyperextension of knees in standing  Baseline:  Goal status: INITIAL   LONG TERM GOALS: Target date: 07/05/2022  Decrease pain in Rt shoulder and Rt knee by 75-100% allowing patient to participate in all exercise and functional activities without pain  Baseline:  Goal status: INITIAL  2.  Increase strength Rt shoulder to 5/5 with no pain  Baseline:  Goal status: INITIAL  3.  Improve posture and alignment with patient to demonstrate improved activation of posterior shoulder girdle with functional use of Rt UE Baseline:  Goal status: INITIAL  4.  Patient to tolerate ascending and descending steps; squats and lunges in the gym with no Rt knee pain  Baseline:  Goal status: INITIAL  5.  Independent in HEP  Baseline:  Goal status: INITIAL  6.  Functional limitation score 74  Baseline:  Goal status: INITIAL  PLAN:  PT  FREQUENCY: 2x/week  PT DURATION: 8 weeks  PLANNED INTERVENTIONS: Therapeutic exercises, Therapeutic activity, Neuromuscular re-education, Balance training, Gait training, Patient/Family education, Self Care, Joint mobilization, Stair training, Aquatic Therapy, Dry Needling, Electrical stimulation, Spinal mobilization, Cryotherapy, Moist heat,  Taping, Vasopneumatic device, Ultrasound, Ionotophoresis 57m/ml Dexamethasone, Manual therapy, and Re-evaluation  PLAN FOR NEXT SESSION: review and progress exercises; continue with postural correction and education; manual work, DN, modalities as indicated.    CSanford PT 05/12/2022, 8:04 AM

## 2022-05-17 ENCOUNTER — Ambulatory Visit: Payer: 59 | Admitting: Rehabilitative and Restorative Service Providers"

## 2022-05-17 ENCOUNTER — Encounter: Payer: Self-pay | Admitting: Rehabilitative and Restorative Service Providers"

## 2022-05-17 DIAGNOSIS — M2241 Chondromalacia patellae, right knee: Secondary | ICD-10-CM | POA: Diagnosis not present

## 2022-05-17 DIAGNOSIS — R29898 Other symptoms and signs involving the musculoskeletal system: Secondary | ICD-10-CM | POA: Diagnosis not present

## 2022-05-17 DIAGNOSIS — M7541 Impingement syndrome of right shoulder: Secondary | ICD-10-CM

## 2022-05-17 DIAGNOSIS — M6281 Muscle weakness (generalized): Secondary | ICD-10-CM | POA: Diagnosis not present

## 2022-05-17 DIAGNOSIS — R293 Abnormal posture: Secondary | ICD-10-CM

## 2022-05-17 NOTE — Therapy (Signed)
OUTPATIENT PHYSICAL THERAPY SHOULDER AND KNEE TREATMENT    Patient Name: Joanna Hayes MRN: [id number] DOB:July 06, 1975, 47 y.o., female Today's Date: 05/17/2022  END OF SESSION:  PT End of Session - 05/17/22 0806     Visit Number 3    Number of Visits 16    Date for PT Re-Evaluation 07/05/22    Authorization Type [facility] [employer] $40 copay    PT Start Time 0802    PT Stop Time 0850    PT Time Calculation (min) 48 min    Activity Tolerance Patient tolerated treatment well             Past Medical History:  Diagnosis Date   Normocytic anemia due to blood loss 04/27/2018   Obesity    Past Surgical History:  Procedure Laterality Date   COLONOSCOPY  03/12/2020   NO PAST SURGERIES     WISDOM TOOTH EXTRACTION     Patient Active Problem List   Diagnosis Date Noted   Impingement syndrome, shoulder, right 05/04/2022   Chondromalacia of patellofemoral joint, right 05/04/2022   Normocytic anemia due to blood loss 04/27/2018   Family history of thyroid disease 04/23/2018   Vitamin D insufficiency 04/12/2016   Intermittent low back pain 04/11/2016   Dyslipidemia 10/16/2013    PCP: Samuel Bouche, NP  REFERRING PROVIDER: Dr Aundria Mems  REFERRING DIAG: impingement Rt shoulder; patellofemoral chondromalacia Rt  THERAPY DIAG:  No diagnosis found.  Rationale for Evaluation and Treatment: Rehabilitation  ONSET DATE: 04/26/22 shoulder; ~03/28/21 knee   SUBJECTIVE:                                                                                                                                                                                      SUBJECTIVE STATEMENT:  Patient reports shoulder is still tight with exercises. She is not noticing pain with exercises. Knee feels better. She is using tape at home.   PERTINENT HISTORY:  Patient reports onset of Rt shoulder pain ~ 2 weeks ago with no known injury. She noticed pain in the Rt shoulder after exercise class and pain has  persisted for the past 2 weeks. Pain is improving some in the past week but still painful with elevating Rt shoulder to side with rotation.  She has Rt knee pain with certain activities including squats, ascending and descending stairs. She can hear crepitus in the knee more recently. Symptoms have been noticeable for the past 1-2 years. Now wearing a brace when she is in the gym.History of Rt shoulder pain ~ 10 years ago which resolved with rest. Denies any medical problems.   PAIN:  Are you having pain? Yes: NPRS scale:  0/10 shoulder or knee  Pain location: Rt shoulder; Rt knee  Pain description: shoulder sharp; knee dull, clicking  Aggravating factors: shoulder abduction with rotation; knee stairs, squats, lunges  Relieving factors: avoiding those activities   PRECAUTIONS: None  WEIGHT BEARING RESTRICTIONS: No  FALLS:  Has patient fallen in last 6 months? 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 miles daily; household chores    PATIENT GOALS:get rid of the pain in the shoulder and knee and keep the symptoms from returning   NEXT MD VISIT: 06/15/22  OBJECTIVE:   DIAGNOSTIC FINDINGS:  Xrays: 05/04/22 - bilat knees - No evidence of fracture, dislocation, or joint effusion. No evidence of arthropathy or other focal bone abnormality. Soft tissues are unremarkable. 05/04/22 - Rt shoulder - no evidence of fracture or dislocation; no evidence of arthropathy or other focal bone abnormality. Soft tissues are unremarkable.  PATIENT SURVEYS:  FOTO 52; goal 49   POSTURE: Patient presents with head forward posture with increased thoracic kyphosis; shoulders rounded and elevated; scapulae abducted and rotated along the thoracic spine; head of the humerus anterior in orientation.   UPPER EXTREMITY ROM: pain and tightness Rt ROM   Active ROM Right eval Left eval  Shoulder flexion 144 151   Shoulder extension 39 45  Shoulder abduction 140 154  Shoulder adduction    Shoulder internal rotation Thumb T10 Thumb T7  Shoulder external rotation 90 90  Elbow flexion    Elbow extension    Wrist flexion    Wrist extension    Wrist ulnar deviation    Wrist radial deviation    Wrist pronation    Wrist supination    (Blank rows = not tested) LE Strength:  LE strength grossly WFL's patient has muscular imbalance with lateral quads more activated than medial Rt LE   UPPER EXTREMITY MMT: WFL's bilat shoulders except as noted; pain with resisted shoulder abduction; rotation  MMT Right eval Left eval  Shoulder flexion    Shoulder extension    Shoulder abduction 5-/5 pain   Shoulder adduction    Shoulder internal rotation 5-/5 pain    Shoulder external rotation 4+/5 pain   Middle trapezius    Lower trapezius    Elbow flexion    Elbow extension    Wrist flexion    Wrist extension    Wrist ulnar deviation    Wrist radial deviation    Wrist pronation    Wrist supination    Grip strength (lbs)    (Blank rows = not tested)  LE ROM:  WFL's  Pecs/Biceps: tight Rt > Lt   PALPATION:  Shoulder: muscular tightness pecs/biceps; upper trap; leveator; teres Rt > Lt  Knee; tight with muscular banding Rt knee lateral quad insertion at superior lateral pole of patella    TODAY'S TREATMENT:  [facility] Adult PT Treatment:                                                 [facility] Adult PT Treatment:                                                DATE: 05/17/22 Therapeutic Exercise: Doorway stretch 3 positions 30 sec x 2 each  Shoulder flexion stretch stepping through doorway 30 sec x 3 Chin tuck with noodle 5 sec x 5 Scap squeeze with noodle 10 sec x 5 L's with noodle x 10 Scap squeeze yellow TB 3 sec x 10 x 2   W's with noodle x 10  W with noodle yellow B 3  sec x 10 x 2  Row blue TB 3 sec x 10  Bow ad arrow blue TB 3 sec x 10  Quad set with patellar taping 5 sec x 5 SLR in ER 3 sec x 10  Wall squat with ball btn knees 10 sec x 10  Sit to stand slow stand to sit x 10  Hip adduction Rt sidelying 3 sec x 10  Manual Therapy: Taping Rt knee to improve patellar alignment Rock tape  Deep tissue work- transverse friction massage Lateral Rt quad at patellar insertion ~ 1 min 2x/day Neuromuscular re-ed: Activation of medial quad Therapeutic Activity: Myofacial ball release work standing for Rt shoulder girdle  Modalities:  Self Care: Avoid standing with knees hyperextended   [facility] Adult PT Treatment:                                                DATE: 05/12/22 Therapeutic Exercise: Doorway stretch 3 positions 30 sec x 2 each  Chin tuck with noodle 5 sec x 5 Scap squeeze with noodle 10 sec x 5 L's with noodle x 10 Scap squeeze yellow TB 3 sec x 10 x 2   W's with noodle x 10  W with noodle yellow B 3 sec x 10 x 2  Quad set with patellar taping 5 sec x 5 SLR in ER 3 sec x 10  Wall squat with ball btn knees 10 sec x 10  Manual Therapy: Taping Rt knee to improve patellar alignment Rock tape  Deep tissue work- transverse friction massage Lateral Rt quad at patellar insertion ~ 1 min 2x/day Neuromuscular re-ed: Activation of medial quad Therapeutic Activity: Myofacial ball release work standing for Rt shoulder girdle  Modalities:  Self Care: Avoid standing with knees hyperextended      PATIENT EDUCATION: Education details: POC; HEP  Person educated: Patient Education method: Explanation, Demonstration, Tactile cues, Verbal cues, and Handouts Education comprehension: verbalized understanding, returned demonstration, verbal cues required, tactile cues required, and needs further education  HOME EXERCISE PROGRAM: Access Code: [place] URL: [email or web address] Date: 05/17/2022 Prepared by: Gillermo Murdoch  Program  Notes Avoid locking knees when standing   Exercises - Seated Cervical Retraction  - 3 x daily - 7 x weekly - 1 sets - 10 reps - Standing Scapular Retraction  - 3 x daily - 7 x weekly -  1 sets - 10 reps - 10 hold - Shoulder External Rotation and Scapular Retraction  - 3 x daily - 7 x weekly - 1 sets - 10 reps - 3-5 sec   hold - Shoulder External Rotation and Scapular Retraction with Resistance  - 2 x daily - 7 x weekly - 1 sets - 10 reps - 3-5 sec  hold - Shoulder External Rotation in 45 Degrees Abduction  - 2 x daily - 7 x weekly - 1-2 sets - 10 reps - 3 sec  hold - Doorway Pec Stretch at 60 Degrees Abduction  - 3 x daily - 7 x weekly - 1 sets - 3 reps - Shoulder W - External Rotation with Resistance  - 2 x daily - 7 x weekly - 1-2 sets - 10 reps - 3 sec  hold - Seated Cervical Sidebending AROM  - 2 x daily - 7 x weekly - 1 sets - 3-5 reps - 5-10 sec  hold - Standing Backward Shoulder Rolls  - 2 x daily - 7 x weekly - 1 sets - 10 reps - 1-2 sec  hold - Doorway Pec Stretch at 90 Degrees Abduction  - 3 x daily - 7 x weekly - 1 sets - 3 reps - 30 seconds  hold - Doorway Pec Stretch at 120 Degrees Abduction  - 3 x daily - 7 x weekly - 1 sets - 3 reps - 30 second hold  hold - Prone Quadriceps Stretch with Strap  - 2 x daily - 7 x weekly - 1 sets - 3 reps - 30 sec  hold - Supine Piriformis Stretch with Leg Straight  - 2 x daily - 7 x weekly - 1 sets - 3 reps - 30 sec  hold - Supine ITB Stretch with Strap  - 2 x daily - 7 x weekly - 1 sets - 3 reps - 30 sec  hold - Supine Quad Set  - 2 x daily - 7 x weekly - 1 sets - 10 reps - 3 sec  hold - Straight Leg Raise with External Rotation  - 2 x daily - 7 x weekly - 1 sets - 10 reps - 3-5 sec  hold - Wall Squat with Ball between Knees  - 2 x daily - 7 x weekly - 1-2 sets - 10 reps - 10 sec  hold - Standing Bicep Stretch at Wall  - 2 x daily - 7 x weekly - 1 sets - 3 reps - 30 sec  hold - Standing Shoulder Flexion Stretch on Wall  - 2 x daily - 7 x weekly - 2  sets - 3 reps - 10 sec  hold - Sidelying Hip Adduction  - 2 x daily - 7 x weekly - 2-3 sets - 10 reps - 3-5 sec  hold - Sit to Stand  - 2 x daily - 7 x weekly - 1 sets - 10 reps - 3-5 sec  hold - Standing Bilateral Low Shoulder Row with Anchored Resistance  - 2 x daily - 7 x weekly - 1-3 sets - 10 reps - 2-3 sec  hold - Drawing Bow  - 1 x daily - 7 x weekly - 1 sets - 10 reps - 3 sec  hold  Patient Education - Office Posture  ASSESSMENT:  CLINICAL IMPRESSION: Patient reports little to no Rt shoulder pain with exercises. She does feel stretching with doorway stretch. Knee feels more stable and less painful with tape. Doing  exercises at her gym without knee pain. Reviewed and progressed with exercises.    OBJECTIVE IMPAIRMENTS: Rt shoulder pain for the past 2 weeks with no known injury but does report similar episode of Rt shoulder pain ~ 10 years ago which resolved with rest. She has poor posture and alignment; limited Rt > Lt shoulder ROM; poor muscular balance; poor scapular control for shoulder elevation; muscular tightness with palpation; pain with functional activities. Patient reports pain in Rt knee for 1-2 years with no known injury. She stands with bilat knees in hyperextension; muscular tightness to palpation lateral Rt quad to insertion of quad at superior lateral patella; pain with patella grind; pain with ascending and descending steps; squats; lunges  GOALS: Goals reviewed with patient? Yes  SHORT TERM GOALS: Target date: 06/07/2022   Independent in initial HEP  Baseline: Goal status: INITIAL  2.  Demonstration and verbalization of improved posture and alignment to decrease postural infulcnces for shoulder pain  Baseline:  Goal status: INITIAL  3.  Avoid hyperextension of knees in standing  Baseline:  Goal status: INITIAL   LONG TERM GOALS: Target date: 07/05/2022  Decrease pain in Rt shoulder and Rt knee by 75-100% allowing patient to participate in all exercise and  functional activities without pain  Baseline:  Goal status: INITIAL  2.  Increase strength Rt shoulder to 5/5 with no pain  Baseline:  Goal status: INITIAL  3.  Improve posture and alignment with patient to demonstrate improved activation of posterior shoulder girdle with functional use of Rt UE Baseline:  Goal status: INITIAL  4.  Patient to tolerate ascending and descending steps; squats and lunges in the gym with no Rt knee pain  Baseline:  Goal status: INITIAL  5.  Independent in HEP  Baseline:  Goal status: INITIAL  6.  Functional limitation score 74  Baseline:  Goal status: INITIAL  PLAN:  PT FREQUENCY: 2x/week  PT DURATION: 8 weeks  PLANNED INTERVENTIONS: Therapeutic exercises, Therapeutic activity, Neuromuscular re-education, Balance training, Gait training, Patient/Family education, Self Care, Joint mobilization, Stair training, Aquatic Therapy, Dry Needling, Electrical stimulation, Spinal mobilization, Cryotherapy, Moist heat, Taping, Vasopneumatic device, Ultrasound, Ionotophoresis 63m/ml Dexamethasone, Manual therapy, and Re-evaluation  PLAN FOR NEXT SESSION: review and progress exercises; continue with postural correction and education; manual work, DN, modalities as indicated.    [facility] [facility] 05/17/2022, 8:06 AM

## 2022-05-19 ENCOUNTER — Encounter: Payer: Self-pay | Admitting: Rehabilitative and Restorative Service Providers"

## 2022-05-19 ENCOUNTER — Ambulatory Visit: Payer: 59 | Admitting: Rehabilitative and Restorative Service Providers"

## 2022-05-19 DIAGNOSIS — M2241 Chondromalacia patellae, right knee: Secondary | ICD-10-CM | POA: Diagnosis not present

## 2022-05-19 DIAGNOSIS — R293 Abnormal posture: Secondary | ICD-10-CM | POA: Diagnosis not present

## 2022-05-19 DIAGNOSIS — M6281 Muscle weakness (generalized): Secondary | ICD-10-CM | POA: Diagnosis not present

## 2022-05-19 DIAGNOSIS — M7541 Impingement syndrome of right shoulder: Secondary | ICD-10-CM

## 2022-05-19 DIAGNOSIS — R29898 Other symptoms and signs involving the musculoskeletal system: Secondary | ICD-10-CM

## 2022-05-19 NOTE — Therapy (Signed)
OUTPATIENT PHYSICAL THERAPY SHOULDER AND KNEE TREATMENT    Patient Name: Joanna Hayes MRN: [id number] DOB:[birth year], 47 y.o., female Today's Date: 05/19/2022  END OF SESSION:  PT End of Session - 05/19/22 0807     Visit Number 4    Number of Visits 16    Date for PT Re-Evaluation 07/05/22    Authorization Type [facility] [employer] $40 copay    PT Start Time 0802    PT Stop Time 0851    PT Time Calculation (min) 49 min    Activity Tolerance Patient tolerated treatment well             Past Medical History:  Diagnosis Date   Normocytic anemia due to blood loss 04/27/2018   Obesity    Past Surgical History:  Procedure Laterality Date   COLONOSCOPY  03/12/2020   NO PAST SURGERIES     WISDOM TOOTH EXTRACTION     Patient Active Problem List   Diagnosis Date Noted   Impingement syndrome, shoulder, right 05/04/2022   Chondromalacia of patellofemoral joint, right 05/04/2022   Normocytic anemia due to blood loss 04/27/2018   Family history of thyroid disease 04/23/2018   Vitamin D insufficiency 04/12/2016   Intermittent low back pain 04/11/2016   Dyslipidemia 10/16/2013    PCP: Samuel Bouche, NP  REFERRING PROVIDER: Dr Aundria Mems  REFERRING DIAG: impingement Rt shoulder; patellofemoral chondromalacia Rt  THERAPY DIAG:  Shoulder impingement syndrome, right  Other symptoms and signs involving the musculoskeletal system  Abnormal posture  Chondromalacia of patellofemoral joint, right  Muscle weakness (generalized)  Rationale for Evaluation and Treatment: Rehabilitation  ONSET DATE: 04/26/22 shoulder; ~03/28/21 knee   SUBJECTIVE:                                                                                                                                                                                      SUBJECTIVE STATEMENT:  Patient reports shoulder is still tight with exercises but less tight. Can tell she is making progress. She is not noticing pain with  exercises. Knee feels better. She is using tape at home.   PERTINENT HISTORY:  Patient reports onset of Rt shoulder pain ~ 2 weeks ago with no known injury. She noticed pain in the Rt shoulder after exercise class and pain has persisted for the past 2 weeks. Pain is improving some in the past week but still painful with elevating Rt shoulder to side with rotation.  She has Rt knee pain with certain activities including squats, ascending and descending stairs. She can hear crepitus in the knee more recently. Symptoms have been noticeable for the past 1-2 years. Now wearing a brace when  she is in the gym.History of Rt shoulder pain ~ 10 years ago which resolved with rest. Denies any medical problems.   PAIN:  Are you having pain? Yes: NPRS scale: 0/10 shoulder or knee  Pain location: Rt shoulder; Rt knee  Pain description: shoulder sharp; knee dull, clicking  Aggravating factors: shoulder abduction with rotation; knee stairs, squats, lunges  Relieving factors: avoiding those activities   PRECAUTIONS: None  WEIGHT BEARING RESTRICTIONS: No  FALLS:  Has patient fallen in last 6 months? 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 miles daily; household chores    PATIENT GOALS:get rid of the pain in the shoulder and knee and keep the symptoms from returning   NEXT MD VISIT: 06/15/22  OBJECTIVE:   DIAGNOSTIC FINDINGS:  Xrays: 05/04/22 - bilat knees - No evidence of fracture, dislocation, or joint effusion. No evidence of arthropathy or other focal bone abnormality. Soft tissues are unremarkable. 05/04/22 - Rt shoulder - no evidence of fracture or dislocation; no evidence of arthropathy or other focal bone abnormality. Soft tissues are unremarkable.  PATIENT SURVEYS:  FOTO 52; goal 47   POSTURE: Patient presents with head forward posture with increased thoracic kyphosis; shoulders rounded and  elevated; scapulae abducted and rotated along the thoracic spine; head of the humerus anterior in orientation.   UPPER EXTREMITY ROM: pain and tightness Rt ROM   Active ROM Right eval Left eval  Shoulder flexion 144 151  Shoulder extension 39 45  Shoulder abduction 140 154  Shoulder adduction    Shoulder internal rotation Thumb T10 Thumb T7  Shoulder external rotation 90 90  Elbow flexion    Elbow extension    Wrist flexion    Wrist extension    Wrist ulnar deviation    Wrist radial deviation    Wrist pronation    Wrist supination    (Blank rows = not tested) LE Strength:  LE strength grossly WFL's patient has muscular imbalance with lateral quads more activated than medial Rt LE   UPPER EXTREMITY MMT: WFL's bilat shoulders except as noted; pain with resisted shoulder abduction; rotation  MMT Right eval Left eval  Shoulder flexion    Shoulder extension    Shoulder abduction 5-/5 pain   Shoulder adduction    Shoulder internal rotation 5-/5 pain    Shoulder external rotation 4+/5 pain   Middle trapezius    Lower trapezius    Elbow flexion    Elbow extension    Wrist flexion    Wrist extension    Wrist ulnar deviation    Wrist radial deviation    Wrist pronation    Wrist supination    Grip strength (lbs)    (Blank rows = not tested)  LE ROM:  WFL's  Pecs/Biceps: tight Rt > Lt   PALPATION:  Shoulder: muscular tightness pecs/biceps; upper trap; leveator; teres Rt > Lt  Knee; tight with muscular banding Rt knee lateral quad insertion at superior lateral pole of patella    TODAY'S TREATMENT:  [facility] Adult PT Treatment:                                                DATE: 05/19/22 Therapeutic Exercise: Doorway stretch 3 positions 30 sec x 2 each  Shoulder flexion stretch stepping through doorway 30 sec x 3 Chin tuck with noodle 5  sec x 5 Scap squeeze with noodle 10 sec x 5 L's with noodle x 10 Scap squeeze yellow TB 3 sec x 10 x 2   W's with noodle x 10  W with noodle yellow B 3 sec x 10 x 2  Row blue TB 3 sec x 10  Bow ad arrow blue TB 3 sec x 10  Quad set with patellar taping 5 sec x 5 SLR in ER 3 sec x 10  Wall squat with ball btn knees 10 sec x 10  Sit to stand slow stand to sit x 10  Hip adduction Rt sidelying 3 sec x 10  Manual Therapy: STM to assess response to DN and manual work Psychologist, educational  Treatment instructions: Expect mild to moderate muscle soreness. S/S of pneumothorax if dry needled over a lung field, and to seek immediate medical attention should they occur. Patient verbalized understanding of these instructions and education.  Patient Consent Given: Yes Education handout provided: Previously provided Muscles treated: Rt pecs; biceps tendon Electrical stimulation performed: No Parameters: N/A Treatment response/outcome: decreased palpable tightness  Taping Rt knee to improve patellar alignment Rock tape  Deep tissue work- transverse friction massage Lateral Rt quad at patellar insertion ~ 1 min 2x/day Neuromuscular re-ed: Activation of medial quad Therapeutic Activity: Myofacial ball release work standing for Rt shoulder girdle  Modalities:  Self Care: Avoid standing with knees hyperextended    [facility] Adult PT Treatment:                                                DATE: 05/17/22 Therapeutic Exercise: Doorway stretch 3 positions 30 sec x 2 each  Shoulder flexion stretch stepping through doorway 30 sec x 3 Chin tuck with noodle 5 sec x 5 Scap squeeze with noodle 10 sec x 5 L's with noodle x 10 Scap squeeze yellow TB 3 sec x 10 x 2   W's with noodle x 10  W with noodle yellow B 3 sec x 10 x 2  Row blue TB 3 sec x 10  Bow and arrow blue TB 3 sec x 10  Quad set with patellar taping 5 sec x 5 SLR in ER 3 sec x 10  Wall squat with ball btn knees 10 sec x 10  Sit to  stand slow stand to sit x 10  Hip adduction Rt sidelying 3 sec x 10  Manual Therapy: Taping Rt knee to improve patellar alignment Rock tape  Deep tissue work- transverse friction massage Lateral Rt quad at patellar insertion ~ 1 min 2x/day Neuromuscular re-ed: Activation of medial quad Therapeutic Activity: Myofacial ball release work standing for Rt shoulder girdle  Modalities:  Self Care: Avoid standing with knees hyperextended    PATIENT EDUCATION: Education details: POC; HEP  Person educated: Patient Education method: Explanation, Demonstration, Tactile cues, Verbal cues, and Handouts Education comprehension: verbalized understanding,  returned demonstration, verbal cues required, tactile cues required, and needs further education  HOME EXERCISE PROGRAM: Access Code: [place] URL: [email or web address] Date: 05/19/2022 Prepared by: Gillermo Murdoch  Program Notes Avoid locking knees when standing nerve stretch lying on back - tuck chin, tip and turn head to left, right arm out to side in comfortable position mild stretch hold 1 min x 2 x twice a day   Exercises - Seated Cervical Retraction  - 3 x daily - 7 x weekly - 1 sets - 10 reps - Standing Scapular Retraction  - 3 x daily - 7 x weekly - 1 sets - 10 reps - 10 hold - Shoulder External Rotation and Scapular Retraction  - 3 x daily - 7 x weekly - 1 sets - 10 reps - 3-5 sec   hold - Shoulder External Rotation and Scapular Retraction with Resistance  - 2 x daily - 7 x weekly - 1 sets - 10 reps - 3-5 sec  hold - Shoulder External Rotation in 45 Degrees Abduction  - 2 x daily - 7 x weekly - 1-2 sets - 10 reps - 3 sec  hold - Doorway Pec Stretch at 60 Degrees Abduction  - 3 x daily - 7 x weekly - 1 sets - 3 reps - Shoulder W - External Rotation with Resistance  - 2 x daily - 7 x weekly - 1-2 sets - 10 reps - 3 sec  hold - Seated Cervical Sidebending AROM  - 2 x daily - 7 x weekly - 1 sets - 3-5 reps - 5-10 sec  hold -  Standing Backward Shoulder Rolls  - 2 x daily - 7 x weekly - 1 sets - 10 reps - 1-2 sec  hold - Doorway Pec Stretch at 90 Degrees Abduction  - 3 x daily - 7 x weekly - 1 sets - 3 reps - 30 seconds  hold - Doorway Pec Stretch at 120 Degrees Abduction  - 3 x daily - 7 x weekly - 1 sets - 3 reps - 30 second hold  hold - Prone Quadriceps Stretch with Strap  - 2 x daily - 7 x weekly - 1 sets - 3 reps - 30 sec  hold - Supine Piriformis Stretch with Leg Straight  - 2 x daily - 7 x weekly - 1 sets - 3 reps - 30 sec  hold - Supine ITB Stretch with Strap  - 2 x daily - 7 x weekly - 1 sets - 3 reps - 30 sec  hold - Supine Quad Set  - 2 x daily - 7 x weekly - 1 sets - 10 reps - 3 sec  hold - Straight Leg Raise with External Rotation  - 2 x daily - 7 x weekly - 1 sets - 10 reps - 3-5 sec  hold - Wall Squat with Ball between Knees  - 2 x daily - 7 x weekly - 1-2 sets - 10 reps - 10 sec  hold - Standing Bicep Stretch at Wall  - 2 x daily - 7 x weekly - 1 sets - 3 reps - 30 sec  hold - Standing Shoulder Flexion Stretch on Wall  - 2 x daily - 7 x weekly - 2 sets - 3 reps - 10 sec  hold - Sidelying Hip Adduction  - 2 x daily - 7 x weekly - 2-3 sets - 10 reps - 3-5 sec  hold - Sit to Stand  - 2 x daily -  7 x weekly - 1 sets - 10 reps - 3-5 sec  hold - Standing Bilateral Low Shoulder Row with Anchored Resistance  - 2 x daily - 7 x weekly - 1-3 sets - 10 reps - 2-3 sec  hold - Drawing Bow  - 1 x daily - 7 x weekly - 1 sets - 10 reps - 3 sec  hold - Supine Lower Trunk Rotation  - 2 x daily - 7 x weekly - 1 sets - 3-5 reps - 20-30 sec  hold  Patient Education - Office Posture  ASSESSMENT:  CLINICAL IMPRESSION: Patient reports little to no Rt shoulder pain with exercises but continued tightness in the front of shoulder with stretches and some intermittent numbness with stretch. Added neural mobilization. Knee feels more stable and less painful with tape. Doing exercises at her gym without knee pain. Reviewed and  progressed with exercises.    OBJECTIVE IMPAIRMENTS: Rt shoulder pain for the past 2 weeks with no known injury but does report similar episode of Rt shoulder pain ~ 10 years ago which resolved with rest. She has poor posture and alignment; limited Rt > Lt shoulder ROM; poor muscular balance; poor scapular control for shoulder elevation; muscular tightness with palpation; pain with functional activities. Patient reports pain in Rt knee for 1-2 years with no known injury. She stands with bilat knees in hyperextension; muscular tightness to palpation lateral Rt quad to insertion of quad at superior lateral patella; pain with patella grind; pain with ascending and descending steps; squats; lunges  GOALS: Goals reviewed with patient? Yes  SHORT TERM GOALS: Target date: 06/07/2022   Independent in initial HEP  Baseline: Goal status: INITIAL  2.  Demonstration and verbalization of improved posture and alignment to decrease postural infulcnces for shoulder pain  Baseline:  Goal status: INITIAL  3.  Avoid hyperextension of knees in standing  Baseline:  Goal status: INITIAL   LONG TERM GOALS: Target date: 07/05/2022  Decrease pain in Rt shoulder and Rt knee by 75-100% allowing patient to participate in all exercise and functional activities without pain  Baseline:  Goal status: INITIAL  2.  Increase strength Rt shoulder to 5/5 with no pain  Baseline:  Goal status: INITIAL  3.  Improve posture and alignment with patient to demonstrate improved activation of posterior shoulder girdle with functional use of Rt UE Baseline:  Goal status: INITIAL  4.  Patient to tolerate ascending and descending steps; squats and lunges in the gym with no Rt knee pain  Baseline:  Goal status: INITIAL  5.  Independent in HEP  Baseline:  Goal status: INITIAL  6.  Functional limitation score 74  Baseline:  Goal status: INITIAL  PLAN:  PT FREQUENCY: 2x/week  PT DURATION: 8 weeks  PLANNED  INTERVENTIONS: Therapeutic exercises, Therapeutic activity, Neuromuscular re-education, Balance training, Gait training, Patient/Family education, Self Care, Joint mobilization, Stair training, Aquatic Therapy, Dry Needling, Electrical stimulation, Spinal mobilization, Cryotherapy, Moist heat, Taping, Vasopneumatic device, Ultrasound, Ionotophoresis 84m/ml Dexamethasone, Manual therapy, and Re-evaluation  PLAN FOR NEXT SESSION: review and progress exercises; continue with postural correction and education; manual work, DN, modalities as indicated.    [place] PT 05/19/2022, 8:08 AM

## 2022-05-26 ENCOUNTER — Ambulatory Visit: Payer: 59 | Admitting: Rehabilitative and Restorative Service Providers"

## 2022-05-26 ENCOUNTER — Encounter: Payer: Self-pay | Admitting: Rehabilitative and Restorative Service Providers"

## 2022-05-26 DIAGNOSIS — M6281 Muscle weakness (generalized): Secondary | ICD-10-CM | POA: Diagnosis not present

## 2022-05-26 DIAGNOSIS — R29898 Other symptoms and signs involving the musculoskeletal system: Secondary | ICD-10-CM

## 2022-05-26 DIAGNOSIS — M7541 Impingement syndrome of right shoulder: Secondary | ICD-10-CM

## 2022-05-26 DIAGNOSIS — R293 Abnormal posture: Secondary | ICD-10-CM

## 2022-05-26 DIAGNOSIS — M2241 Chondromalacia patellae, right knee: Secondary | ICD-10-CM | POA: Diagnosis not present

## 2022-05-26 NOTE — Therapy (Signed)
OUTPATIENT PHYSICAL THERAPY SHOULDER AND KNEE TREATMENT    Patient Name: Joanna Hayes MRN: [id number] DOB:1975-08-26, 47 y.o., female Today's Date: 05/26/2022  END OF SESSION:  PT End of Session - 05/26/22 1450     Visit Number 5    Number of Visits 16    Date for PT Re-Evaluation 07/05/22    Authorization Type [facility] [employer] $40 copay    PT Start Time 1446    PT Stop Time 1533    PT Time Calculation (min) 47 min    Activity Tolerance Patient tolerated treatment well             Past Medical History:  Diagnosis Date   Normocytic anemia due to blood loss 04/27/2018   Obesity    Past Surgical History:  Procedure Laterality Date   COLONOSCOPY  03/12/2020   NO PAST SURGERIES     WISDOM TOOTH EXTRACTION     Patient Active Problem List   Diagnosis Date Noted   Impingement syndrome, shoulder, right 05/04/2022   Chondromalacia of patellofemoral joint, right 05/04/2022   Normocytic anemia due to blood loss 04/27/2018   Family history of thyroid disease 04/23/2018   Vitamin D insufficiency 04/12/2016   Intermittent low back pain 04/11/2016   Dyslipidemia 10/16/2013    PCP: Samuel Bouche, NP  REFERRING PROVIDER: Dr Aundria Mems  REFERRING DIAG: impingement Rt shoulder; patellofemoral chondromalacia Rt  THERAPY DIAG:  Shoulder impingement syndrome, right  Other symptoms and signs involving the musculoskeletal system  Abnormal posture  Chondromalacia of patellofemoral joint, right  Muscle weakness (generalized)  Rationale for Evaluation and Treatment: Rehabilitation  ONSET DATE: 04/26/22 shoulder; ~03/28/21 knee   SUBJECTIVE:                                                                                                                                                                                      SUBJECTIVE STATEMENT:  Patient reports no longer having numbness in hand with shoulder stretches. Can tell she is making progress. She is not noticing pain with  exercises. Knee feels better. She is using tape at home.   PERTINENT HISTORY:  Patient reports onset of Rt shoulder pain ~ 2 weeks ago with no known injury. She noticed pain in the Rt shoulder after exercise class and pain has persisted for the past 2 weeks. Pain is improving some in the past week but still painful with elevating Rt shoulder to side with rotation.  She has Rt knee pain with certain activities including squats, ascending and descending stairs. She can hear crepitus in the knee more recently. Symptoms have been noticeable for the past 1-2 years. Now wearing a brace when  she is in the gym.History of Rt shoulder pain ~ 10 years ago which resolved with rest. Denies any medical problems.   PAIN:  Are you having pain? Yes: NPRS scale: 0/10 shoulder or knee  Pain location: Rt shoulder; Rt knee  Pain description: shoulder sharp; knee dull, clicking  Aggravating factors: shoulder abduction with rotation; knee stairs, squats, lunges  Relieving factors: avoiding those activities   PRECAUTIONS: None  WEIGHT BEARING RESTRICTIONS: No  FALLS:  Has patient fallen in last 6 months? 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 miles daily; household chores    PATIENT GOALS:get rid of the pain in the shoulder and knee and keep the symptoms from returning   NEXT MD VISIT: 06/15/22  OBJECTIVE:   DIAGNOSTIC FINDINGS:  Xrays: 05/04/22 - bilat knees - No evidence of fracture, dislocation, or joint effusion. No evidence of arthropathy or other focal bone abnormality. Soft tissues are unremarkable. 05/04/22 - Rt shoulder - no evidence of fracture or dislocation; no evidence of arthropathy or other focal bone abnormality. Soft tissues are unremarkable.  PATIENT SURVEYS:  FOTO 52; goal 67   POSTURE: Patient presents with head forward posture with increased thoracic kyphosis; shoulders rounded and  elevated; scapulae abducted and rotated along the thoracic spine; head of the humerus anterior in orientation.   UPPER EXTREMITY ROM: pain and tightness Rt ROM   Active ROM Right eval Left eval  Shoulder flexion 144 151  Shoulder extension 39 45  Shoulder abduction 140 154  Shoulder adduction    Shoulder internal rotation Thumb T10 Thumb T7  Shoulder external rotation 90 90  Elbow flexion    Elbow extension    Wrist flexion    Wrist extension    Wrist ulnar deviation    Wrist radial deviation    Wrist pronation    Wrist supination    (Blank rows = not tested) LE Strength:  LE strength grossly WFL's patient has muscular imbalance with lateral quads more activated than medial Rt LE   UPPER EXTREMITY MMT: WFL's bilat shoulders except as noted; pain with resisted shoulder abduction; rotation  MMT Right eval Left eval  Shoulder flexion    Shoulder extension    Shoulder abduction 5-/5 pain   Shoulder adduction    Shoulder internal rotation 5-/5 pain    Shoulder external rotation 4+/5 pain   Middle trapezius    Lower trapezius    Elbow flexion    Elbow extension    Wrist flexion    Wrist extension    Wrist ulnar deviation    Wrist radial deviation    Wrist pronation    Wrist supination    Grip strength (lbs)    (Blank rows = not tested)  LE ROM:  WFL's  Pecs/Biceps: tight Rt > Lt   PALPATION:  Shoulder: muscular tightness pecs/biceps; upper trap; leveator; teres Rt > Lt  Knee; tight with muscular banding Rt knee lateral quad insertion at superior lateral pole of patella    TODAY'S TREATMENT:  [facility] Adult PT Treatment:                                                 DATE: 05/26/22 Therapeutic Exercise: Hip abduction recruiting glut med 2 sec x 10 Rt/Lt Hip adduction Rt sidelying 3 sec x 10 Captain morgan at wall pressing in w/  outside hip x 3 sec x 10 Rt/Lt; repeated with ball Lt hip shallow knee bends 2 sec x 10 x 2 Slider bilat fwd/side/back/back across x 10 Rt/Lt Modified lunge back foot on step x 10 Rt/Lt   Quad set with patellar taping 5 sec x 5 SLR in ER 3 sec x 10  Wall squat with ball btn knees 10 sec x 10 (Rt foot back behind Lt ~ 2 inches)  Sit to stand slow stand to sit x 10  Step up 2 in with green TB medial knee x 10     DATE: 05/19/22 (Dry Needling)  Therapeutic Exercise: Doorway stretch 3 positions 30 sec x 2 each  Shoulder flexion stretch stepping through doorway 30 sec x 3 Chin tuck with noodle 5 sec x 5 Scap squeeze with noodle 10 sec x 5 L's with noodle x 10 Scap squeeze yellow TB 3 sec x 10 x 2   W's with noodle x 10  W with noodle yellow B 3 sec x 10 x 2  Row blue TB 3 sec x 10  Bow ad arrow blue TB 3 sec x 10  Quad set with patellar taping 5 sec x 5 SLR in ER 3 sec x 10  Wall squat with ball btn knees 10 sec x 10  Sit to stand slow stand to sit x 10  Hip adduction Rt sidelying 3 sec x 10  Manual Therapy: STM to assess response to DN and manual work Psychologist, educational  Treatment instructions: Expect mild to moderate muscle soreness. S/S of pneumothorax if dry needled over a lung field, and to seek immediate medical attention should they occur. Patient verbalized understanding of these instructions and education.  Patient Consent Given: Yes Education handout provided: Previously provided Muscles treated: Rt pecs; biceps tendon Electrical stimulation performed: No Parameters: N/A Treatment response/outcome: decreased palpable tightness  Taping Rt knee to improve patellar alignment Rock tape  Deep tissue work- transverse friction massage Lateral Rt quad at patellar insertion ~ 1 min 2x/day Neuromuscular re-ed: Activation of medial quad Therapeutic Activity: Myofacial ball release work standing for Rt shoulder girdle  Modalities:  Self Care: Avoid standing with knees  hyperextended    [facility] Adult PT Treatment:                                                DATE: 05/17/22 Therapeutic Exercise: Doorway stretch 3 positions 30 sec x 2 each  Shoulder flexion stretch stepping through doorway 30 sec x 3 Chin tuck with noodle 5 sec x 5 Scap squeeze with noodle 10 sec x 5 L's with noodle x 10 Scap squeeze yellow TB 3 sec x 10 x 2   W's with noodle x 10  W with noodle yellow B 3 sec x 10 x 2  Row blue TB 3 sec x 10  Bow and arrow blue TB  3 sec x 10  Quad set with patellar taping 5 sec x 5 SLR in ER 3 sec x 10  Wall squat with ball btn knees 10 sec x 10  Sit to stand slow stand to sit x 10  Hip adduction Rt sidelying 3 sec x 10  Manual Therapy: Taping Rt knee to improve patellar alignment Rock tape  Deep tissue work- transverse friction massage Lateral Rt quad at patellar insertion ~ 1 min 2x/day Neuromuscular re-ed: Activation of medial quad Therapeutic Activity: Myofacial ball release work standing for Rt shoulder girdle  Modalities:  Self Care: Avoid standing with knees hyperextended    PATIENT EDUCATION: Education details: POC; HEP  Person educated: Patient Education method: Explanation, Demonstration, Tactile cues, Verbal cues, and Handouts Education comprehension: verbalized understanding, returned demonstration, verbal cues required, tactile cues required, and needs further education  HOME EXERCISE PROGRAM: Access Code: [place] URL: [email or web address] Date: 05/26/2022 Prepared by: Gillermo Murdoch  Program Notes Avoid locking knees when standing nerve stretch lying on back - tuck chin, tip and turn head to left, right arm out to side in comfortable position mild stretch hold 1 min x 2 x twice a day   Exercises - Seated Cervical Retraction  - 3 x daily - 7 x weekly - 1 sets - 10 reps - Standing Scapular Retraction  - 3 x daily - 7 x weekly - 1 sets - 10 reps - 10 hold - Shoulder External Rotation and Scapular Retraction  - 3  x daily - 7 x weekly - 1 sets - 10 reps - 3-5 sec   hold - Shoulder External Rotation and Scapular Retraction with Resistance  - 2 x daily - 7 x weekly - 1 sets - 10 reps - 3-5 sec  hold - Shoulder External Rotation in 45 Degrees Abduction  - 2 x daily - 7 x weekly - 1-2 sets - 10 reps - 3 sec  hold - Doorway Pec Stretch at 60 Degrees Abduction  - 3 x daily - 7 x weekly - 1 sets - 3 reps - Shoulder W - External Rotation with Resistance  - 2 x daily - 7 x weekly - 1-2 sets - 10 reps - 3 sec  hold - Seated Cervical Sidebending AROM  - 2 x daily - 7 x weekly - 1 sets - 3-5 reps - 5-10 sec  hold - Standing Backward Shoulder Rolls  - 2 x daily - 7 x weekly - 1 sets - 10 reps - 1-2 sec  hold - Doorway Pec Stretch at 90 Degrees Abduction  - 3 x daily - 7 x weekly - 1 sets - 3 reps - 30 seconds  hold - Doorway Pec Stretch at 120 Degrees Abduction  - 3 x daily - 7 x weekly - 1 sets - 3 reps - 30 second hold  hold - Prone Quadriceps Stretch with Strap  - 2 x daily - 7 x weekly - 1 sets - 3 reps - 30 sec  hold - Supine Piriformis Stretch with Leg Straight  - 2 x daily - 7 x weekly - 1 sets - 3 reps - 30 sec  hold - Supine ITB Stretch with Strap  - 2 x daily - 7 x weekly - 1 sets - 3 reps - 30 sec  hold - Supine Quad Set  - 2 x daily - 7 x weekly - 1 sets - 10 reps - 3 sec  hold - Straight Leg Raise with External Rotation  -  2 x daily - 7 x weekly - 1 sets - 10 reps - 3-5 sec  hold - Wall Squat with Ball between Knees  - 2 x daily - 7 x weekly - 1-2 sets - 10 reps - 10 sec  hold - Standing Bicep Stretch at [place]  - 2 x daily - 7 x weekly - 1 sets - 3 reps - 30 sec  hold - Standing Shoulder Flexion Stretch on Wall  - 2 x daily - 7 x weekly - 2 sets - 3 reps - 10 sec  hold - Sidelying Hip Adduction  - 2 x daily - 7 x weekly - 2-3 sets - 10 reps - 3-5 sec  hold - Sit to Stand  - 2 x daily - 7 x weekly - 1 sets - 10 reps - 3-5 sec  hold - Standing Bilateral Low Shoulder Row with Anchored Resistance  - 2 x daily -  7 x weekly - 1-3 sets - 10 reps - 2-3 sec  hold - Drawing Bow  - 1 x daily - 7 x weekly - 1 sets - 10 reps - 3 sec  hold - Supine Lower Trunk Rotation  - 2 x daily - 7 x weekly - 1 sets - 3-5 reps - 20-30 sec  hold - Glute Med Wall Lean  - 2 x daily - 7 x weekly - 1-2 sets - 10 reps - 3-5 sec  hold - Sidelying Hip Abduction  - 1 x daily - 7 x weekly - 3 sets - 10 reps - 3-5 sec  hold - 3-Way Lunge on Slider  - 1 x daily - 7 x weekly - 1 sets - 10 reps - Step Up  - 2 x daily - 7 x weekly - 1 sets - 10 reps - 2 sec  hold  Patient Education - Office Posture  ASSESSMENT:  CLINICAL IMPRESSION: Patient reports no Rt shoulder pain or knee pain with exercises and less tightness in the front of shoulder with stretches; no intermittent numbness with stretch. Added neural mobilization. Knee feels more stable with tape at home. Doing exercises at her gym without knee pain. Reviewed and progressed with exercises.  Try BFR next visit   OBJECTIVE IMPAIRMENTS: Rt shoulder pain for the past 2 weeks with no known injury but does report similar episode of Rt shoulder pain ~ 10 years ago which resolved with rest. She has poor posture and alignment; limited Rt > Lt shoulder ROM; poor muscular balance; poor scapular control for shoulder elevation; muscular tightness with palpation; pain with functional activities. Patient reports pain in Rt knee for 1-2 years with no known injury. She stands with bilat knees in hyperextension; muscular tightness to palpation lateral Rt quad to insertion of quad at superior lateral patella; pain with patella grind; pain with ascending and descending steps; squats; lunges  GOALS: Goals reviewed with patient? Yes  SHORT TERM GOALS: Target date: 06/07/2022   Independent in initial HEP  Baseline: Goal status: INITIAL  2.  Demonstration and verbalization of improved posture and alignment to decrease postural infulcnces for shoulder pain  Baseline:  Goal status: INITIAL  3.  Avoid  hyperextension of knees in standing  Baseline:  Goal status: INITIAL   LONG TERM GOALS: Target date: 07/05/2022  Decrease pain in Rt shoulder and Rt knee by 75-100% allowing patient to participate in all exercise and functional activities without pain  Baseline:  Goal status: INITIAL  2.  Increase strength Rt shoulder  to 5/5 with no pain  Baseline:  Goal status: INITIAL  3.  Improve posture and alignment with patient to demonstrate improved activation of posterior shoulder girdle with functional use of Rt UE Baseline:  Goal status: INITIAL  4.  Patient to tolerate ascending and descending steps; squats and lunges in the gym with no Rt knee pain  Baseline:  Goal status: INITIAL  5.  Independent in HEP  Baseline:  Goal status: INITIAL  6.  Functional limitation score 74  Baseline:  Goal status: INITIAL  PLAN:  PT FREQUENCY: 2x/week  PT DURATION: 8 weeks  PLANNED INTERVENTIONS: Therapeutic exercises, Therapeutic activity, Neuromuscular re-education, Balance training, Gait training, Patient/Family education, Self Care, Joint mobilization, Stair training, Aquatic Therapy, Dry Needling, Electrical stimulation, Spinal mobilization, Cryotherapy, Moist heat, Taping, Vasopneumatic device, Ultrasound, Ionotophoresis '4mg'$ /ml Dexamethasone, Manual therapy, and Re-evaluation  PLAN FOR NEXT SESSION: review and progress exercises; continue with postural correction and education; manual work, DN, modalities as indicated.    Everardo All, PT 05/26/2022, 2:51 PM

## 2022-06-01 ENCOUNTER — Encounter: Payer: Self-pay | Admitting: Rehabilitative and Restorative Service Providers"

## 2022-06-01 ENCOUNTER — Ambulatory Visit: Payer: 59 | Attending: Sports Medicine | Admitting: Rehabilitative and Restorative Service Providers"

## 2022-06-01 DIAGNOSIS — M2241 Chondromalacia patellae, right knee: Secondary | ICD-10-CM

## 2022-06-01 DIAGNOSIS — R29898 Other symptoms and signs involving the musculoskeletal system: Secondary | ICD-10-CM

## 2022-06-01 DIAGNOSIS — M7541 Impingement syndrome of right shoulder: Secondary | ICD-10-CM

## 2022-06-01 DIAGNOSIS — R293 Abnormal posture: Secondary | ICD-10-CM

## 2022-06-01 DIAGNOSIS — M6281 Muscle weakness (generalized): Secondary | ICD-10-CM

## 2022-06-01 NOTE — Therapy (Signed)
OUTPATIENT PHYSICAL THERAPY SHOULDER AND KNEE TREATMENT    Patient Name: Joanna Hayes MRN: [id number] DOB:1975/06/13, 47 y.o., female Today's Date: 06/01/2022  END OF SESSION:  PT End of Session - 06/01/22 0847     Visit Number 6    Number of Visits 16    Date for PT Re-Evaluation 07/05/22    Authorization Type [facility] [employer] $40 copay    PT Start Time 0845    PT Stop Time 0930    PT Time Calculation (min) 45 min    Activity Tolerance Patient tolerated treatment well             Past Medical History:  Diagnosis Date   Normocytic anemia due to blood loss 04/27/2018   Obesity    Past Surgical History:  Procedure Laterality Date   COLONOSCOPY  03/12/2020   NO PAST SURGERIES     WISDOM TOOTH EXTRACTION     Patient Active Problem List   Diagnosis Date Noted   Impingement syndrome, shoulder, right 05/04/2022   Chondromalacia of patellofemoral joint, right 05/04/2022   Normocytic anemia due to blood loss 04/27/2018   Family history of thyroid disease 04/23/2018   Vitamin D insufficiency 04/12/2016   Intermittent low back pain 04/11/2016   Dyslipidemia 10/16/2013    PCP: Samuel Bouche, NP  REFERRING PROVIDER: Dr Aundria Mems  REFERRING DIAG: impingement Rt shoulder; patellofemoral chondromalacia Rt  THERAPY DIAG:  Shoulder impingement syndrome, right  Other symptoms and signs involving the musculoskeletal system  Abnormal posture  Chondromalacia of patellofemoral joint, right  Muscle weakness (generalized)  Rationale for Evaluation and Treatment: Rehabilitation  ONSET DATE: 04/26/22 shoulder; ~03/28/21 knee   SUBJECTIVE:                                                                                                                                                                                      SUBJECTIVE STATEMENT:  Patient reports shoulder and knee are better. She no longer has numbness in hand with shoulder stretches. Can tell she is making progress.  She is not noticing pain with exercises. Knee feels better. Still has "crunching" in the knee but no pain. She is using tape at home.   PERTINENT HISTORY:  Patient reports onset of Rt shoulder pain ~ 2 weeks ago with no known injury. She noticed pain in the Rt shoulder after exercise class and pain has persisted for the past 2 weeks. Pain is improving some in the past week but still painful with elevating Rt shoulder to side with rotation.  She has Rt knee pain with certain activities including squats, ascending and descending stairs. She can hear crepitus in the knee more  recently. Symptoms have been noticeable for the past 1-2 years. Now wearing a brace when she is in the gym.History of Rt shoulder pain ~ 10 years ago which resolved with rest. Denies any medical problems.   PAIN:  Are you having pain? Yes: NPRS scale: 0/10 shoulder or knee  Pain location: Rt shoulder; Rt knee  Pain description: shoulder sharp; knee dull, clicking  Aggravating factors: shoulder abduction with rotation; knee stairs, squats, lunges  Relieving factors: avoiding those activities   PRECAUTIONS: None  WEIGHT BEARING RESTRICTIONS: No  FALLS:  Has patient fallen in last 6 months? 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 miles daily; household chores    PATIENT GOALS:get rid of the pain in the shoulder and knee and keep the symptoms from returning   NEXT MD VISIT: 06/15/22  OBJECTIVE:   DIAGNOSTIC FINDINGS:  Xrays: 05/04/22 - bilat knees - No evidence of fracture, dislocation, or joint effusion. No evidence of arthropathy or other focal bone abnormality. Soft tissues are unremarkable. 05/04/22 - Rt shoulder - no evidence of fracture or dislocation; no evidence of arthropathy or other focal bone abnormality. Soft tissues are unremarkable.  PATIENT SURVEYS:  FOTO 52; goal 50   POSTURE: Patient presents with head  forward posture with increased thoracic kyphosis; shoulders rounded and elevated; scapulae abducted and rotated along the thoracic spine; head of the humerus anterior in orientation.   UPPER EXTREMITY ROM: pain and tightness Rt ROM   Active ROM Right eval Left eval  Shoulder flexion 144 151  Shoulder extension 39 45  Shoulder abduction 140 154  Shoulder adduction    Shoulder internal rotation Thumb T10 Thumb T7  Shoulder external rotation 90 90  Elbow flexion    Elbow extension    Wrist flexion    Wrist extension    Wrist ulnar deviation    Wrist radial deviation    Wrist pronation    Wrist supination    (Blank rows = not tested) LE Strength:  LE strength grossly WFL's patient has muscular imbalance with lateral quads more activated than medial Rt LE   UPPER EXTREMITY MMT: WFL's bilat shoulders except as noted; pain with resisted shoulder abduction; rotation  MMT Right eval Left eval  Shoulder flexion    Shoulder extension    Shoulder abduction 5-/5 pain   Shoulder adduction    Shoulder internal rotation 5-/5 pain    Shoulder external rotation 4+/5 pain   Middle trapezius    Lower trapezius    Elbow flexion    Elbow extension    Wrist flexion    Wrist extension    Wrist ulnar deviation    Wrist radial deviation    Wrist pronation    Wrist supination    Grip strength (lbs)    (Blank rows = not tested)  LE ROM:  WFL's  Pecs/Biceps: tight Rt > Lt   PALPATION:  Shoulder: muscular tightness pecs/biceps; upper trap; leveator; teres Rt > Lt  Knee; tight with muscular banding Rt knee lateral quad insertion at superior lateral pole of patella    TODAY'S TREATMENT:  [facility] Adult PT Treatment:                                                 DATE: 05/26/22 Therapeutic Exercise: Doorway stretch 3 positions 30 sec x 2 each  Shoulder  flexion stretch stepping through doorway 30 sec x 3 Chin tuck with noodle 5 sec x 5 Scap squeeze with noodle 10 sec x 5 Scap squeeze yellow TB 3 sec x 10 x 2   W with noodle yellow B 3 sec x 10 x 2  Row blue TB 3 sec x 5  Bow ad arrow blue TB 3 sec x 10   BFR 60% occlusion = 125mHg (max 2013mg)  SLR in ER 5 sec hold x 30/30 sec rest quad set w/ focus medial quad 15 reps/30 sec rest/15 reps/30 sec rest/15 reps Hip flexor stretch supine thomas 30 sec x 2  Hip flexor stretch sitting 30 sec x 2   Self care:  Myofacial ball release hip flexors prone   DATE: 05/26/22 Therapeutic Exercise: Hip abduction recruiting glut med 2 sec x 10 Rt/Lt Hip adduction Rt sidelying 3 sec x 10 Captain morgan at wall pressing in w/ outside hip x 3 sec x 10 Rt/Lt; repeated with ball Lt hip shallow knee bends 2 sec x 10 x 2 Slider bilat fwd/side/back/back across x 10 Rt/Lt Modified lunge back foot on step x 10 Rt/Lt   Quad set with patellar taping 5 sec x 5 SLR in ER 3 sec x 10  Wall squat with ball btn knees 10 sec x 10 (Rt foot back behind Lt ~ 2 inches)  Sit to stand slow stand to sit x 10  Step up 2 in with green TB medial knee x 10     DATE: 05/19/22 (Dry Needling)  Therapeutic Exercise: Doorway stretch 3 positions 30 sec x 2 each  Shoulder flexion stretch stepping through doorway 30 sec x 3 Chin tuck with noodle 5 sec x 5 Scap squeeze with noodle 10 sec x 5 L's with noodle x 10 Scap squeeze yellow TB 3 sec x 10 x 2   W's with noodle x 10  W with noodle yellow B 3 sec x 10 x 2  Row blue TB 3 sec x 10  Bow ad arrow blue TB 3 sec x 10  Quad set with patellar taping 5 sec x 5 SLR in ER 3 sec x 10  Wall squat with ball btn knees 10 sec x 10  Sit to stand slow stand to sit x 10  Hip adduction Rt sidelying 3 sec x 10  Manual Therapy: STM to assess response to DN and manual work TrPsychologist, educationalTreatment instructions: Expect mild to moderate muscle soreness. S/S of pneumothorax if  dry needled over a lung field, and to seek immediate medical attention should they occur. Patient verbalized understanding of these instructions and education.  Patient Consent Given: Yes Education handout provided: Previously provided Muscles treated: Rt pecs; biceps tendon Electrical stimulation performed: No Parameters: N/A Treatment response/outcome: decreased palpable tightness  Taping Rt knee to improve patellar alignment Rock tape  Deep tissue work- transverse friction massage Lateral Rt quad at patellar insertion ~ 1 min 2x/day Neuromuscular re-ed: Activation of medial quad Therapeutic Activity: Myofacial ball release work standing for Rt shoulder girdle  Modalities:  Self Care: Avoid  standing with knees hyperextended    PATIENT EDUCATION: Education details: POC; HEP  Person educated: Patient Education method: Explanation, Demonstration, Tactile cues, Verbal cues, and Handouts Education comprehension: verbalized understanding, returned demonstration, verbal cues required, tactile cues required, and needs further education  HOME EXERCISE PROGRAM: Access Code: [place] URL: [email or web address] Date: 05/26/2022 Prepared by: Gillermo Murdoch  Program Notes Avoid locking knees when standing nerve stretch lying on back - tuck chin, tip and turn head to left, right arm out to side in comfortable position mild stretch hold 1 min x 2 x twice a day   Exercises - Seated Cervical Retraction  - 3 x daily - 7 x weekly - 1 sets - 10 reps - Standing Scapular Retraction  - 3 x daily - 7 x weekly - 1 sets - 10 reps - 10 hold - Shoulder External Rotation and Scapular Retraction  - 3 x daily - 7 x weekly - 1 sets - 10 reps - 3-5 sec   hold - Shoulder External Rotation and Scapular Retraction with Resistance  - 2 x daily - 7 x weekly - 1 sets - 10 reps - 3-5 sec  hold - Shoulder External Rotation in 45 Degrees Abduction  - 2 x daily - 7 x weekly - 1-2 sets - 10 reps - 3 sec   hold - Doorway Pec Stretch at 60 Degrees Abduction  - 3 x daily - 7 x weekly - 1 sets - 3 reps - Shoulder W - External Rotation with Resistance  - 2 x daily - 7 x weekly - 1-2 sets - 10 reps - 3 sec  hold - Seated Cervical Sidebending AROM  - 2 x daily - 7 x weekly - 1 sets - 3-5 reps - 5-10 sec  hold - Standing Backward Shoulder Rolls  - 2 x daily - 7 x weekly - 1 sets - 10 reps - 1-2 sec  hold - Doorway Pec Stretch at 90 Degrees Abduction  - 3 x daily - 7 x weekly - 1 sets - 3 reps - 30 seconds  hold - Doorway Pec Stretch at 120 Degrees Abduction  - 3 x daily - 7 x weekly - 1 sets - 3 reps - 30 second hold  hold - Prone Quadriceps Stretch with Strap  - 2 x daily - 7 x weekly - 1 sets - 3 reps - 30 sec  hold - Supine Piriformis Stretch with Leg Straight  - 2 x daily - 7 x weekly - 1 sets - 3 reps - 30 sec  hold - Supine ITB Stretch with Strap  - 2 x daily - 7 x weekly - 1 sets - 3 reps - 30 sec  hold - Supine Quad Set  - 2 x daily - 7 x weekly - 1 sets - 10 reps - 3 sec  hold - Straight Leg Raise with External Rotation  - 2 x daily - 7 x weekly - 1 sets - 10 reps - 3-5 sec  hold - Wall Squat with Ball between Knees  - 2 x daily - 7 x weekly - 1-2 sets - 10 reps - 10 sec  hold - Standing Bicep Stretch at Wall  - 2 x daily - 7 x weekly - 1 sets - 3 reps - 30 sec  hold - Standing Shoulder Flexion Stretch on Wall  - 2 x daily - 7 x weekly - 2 sets - 3 reps - 10 sec  hold - Sidelying Hip  Adduction  - 2 x daily - 7 x weekly - 2-3 sets - 10 reps - 3-5 sec  hold - Sit to Stand  - 2 x daily - 7 x weekly - 1 sets - 10 reps - 3-5 sec  hold - Standing Bilateral Low Shoulder Row with Anchored Resistance  - 2 x daily - 7 x weekly - 1-3 sets - 10 reps - 2-3 sec  hold - Drawing Bow  - 1 x daily - 7 x weekly - 1 sets - 10 reps - 3 sec  hold - Supine Lower Trunk Rotation  - 2 x daily - 7 x weekly - 1 sets - 3-5 reps - 20-30 sec  hold - Glute Med Wall Lean  - 2 x daily - 7 x weekly - 1-2 sets - 10 reps - 3-5 sec   hold - Sidelying Hip Abduction  - 1 x daily - 7 x weekly - 3 sets - 10 reps - 3-5 sec  hold - 3-Way Lunge on Slider  - 1 x daily - 7 x weekly - 1 sets - 10 reps - Step Up  - 2 x daily - 7 x weekly - 1 sets - 10 reps - 2 sec  hold  Patient Education - Office Posture  ASSESSMENT:  CLINICAL IMPRESSION: Patient reports no Rt shoulder pain or knee pain with exercises and less tightness in the front of shoulder with stretches; no UE intermittent numbness with stretch. Added trial of BFR Rt thigh to work medial quad. Patient reports Rt knee feels more stable with tape at home. Doing exercises at her gym without knee pain. Reviewed and progressed with exercises.     OBJECTIVE IMPAIRMENTS: Rt shoulder pain for the past 2 weeks with no known injury but does report similar episode of Rt shoulder pain ~ 10 years ago which resolved with rest. She has poor posture and alignment; limited Rt > Lt shoulder ROM; poor muscular balance; poor scapular control for shoulder elevation; muscular tightness with palpation; pain with functional activities. Patient reports pain in Rt knee for 1-2 years with no known injury. She stands with bilat knees in hyperextension; muscular tightness to palpation lateral Rt quad to insertion of quad at superior lateral patella; pain with patella grind; pain with ascending and descending steps; squats; lunges  GOALS: Goals reviewed with patient? Yes  SHORT TERM GOALS: Target date: 06/07/2022   Independent in initial HEP  Baseline: Goal status: INITIAL  2.  Demonstration and verbalization of improved posture and alignment to decrease postural infulcnces for shoulder pain  Baseline:  Goal status: INITIAL  3.  Avoid hyperextension of knees in standing  Baseline:  Goal status: INITIAL   LONG TERM GOALS: Target date: 07/05/2022  Decrease pain in Rt shoulder and Rt knee by 75-100% allowing patient to participate in all exercise and functional activities without pain  Baseline:   Goal status: INITIAL  2.  Increase strength Rt shoulder to 5/5 with no pain  Baseline:  Goal status: INITIAL  3.  Improve posture and alignment with patient to demonstrate improved activation of posterior shoulder girdle with functional use of Rt UE Baseline:  Goal status: INITIAL  4.  Patient to tolerate ascending and descending steps; squats and lunges in the gym with no Rt knee pain  Baseline:  Goal status: INITIAL  5.  Independent in HEP  Baseline:  Goal status: INITIAL  6.  Functional limitation score 74  Baseline:  Goal status: INITIAL  PLAN:  PT FREQUENCY: 2x/week  PT DURATION: 8 weeks  PLANNED INTERVENTIONS: Therapeutic exercises, Therapeutic activity, Neuromuscular re-education, Balance training, Gait training, Patient/Family education, Self Care, Joint mobilization, Stair training, Aquatic Therapy, Dry Needling, Electrical stimulation, Spinal mobilization, Cryotherapy, Moist heat, Taping, Vasopneumatic device, Ultrasound, Ionotophoresis '4mg'$ /ml Dexamethasone, Manual therapy, and Re-evaluation  PLAN FOR NEXT SESSION: review and progress exercises; continue with postural correction and education; manual work, DN, modalities as indicated.    Everardo All, PT 06/01/2022, 8:48 AM

## 2022-06-14 ENCOUNTER — Encounter: Payer: Self-pay | Admitting: Rehabilitative and Restorative Service Providers"

## 2022-06-14 ENCOUNTER — Ambulatory Visit: Payer: 59 | Admitting: Rehabilitative and Restorative Service Providers"

## 2022-06-14 DIAGNOSIS — R293 Abnormal posture: Secondary | ICD-10-CM

## 2022-06-14 DIAGNOSIS — R29898 Other symptoms and signs involving the musculoskeletal system: Secondary | ICD-10-CM | POA: Diagnosis not present

## 2022-06-14 DIAGNOSIS — M6281 Muscle weakness (generalized): Secondary | ICD-10-CM

## 2022-06-14 DIAGNOSIS — M2241 Chondromalacia patellae, right knee: Secondary | ICD-10-CM

## 2022-06-14 DIAGNOSIS — M7541 Impingement syndrome of right shoulder: Secondary | ICD-10-CM

## 2022-06-14 NOTE — Therapy (Signed)
OUTPATIENT PHYSICAL THERAPY SHOULDER AND KNEE TREATMENT    Patient Name: Joanna Hayes MRN: [id number] DOB:Jul 06, 1975, 47 y.o., female Today's Date: 06/14/2022  END OF SESSION:  PT End of Session - 06/14/22 1101     Visit Number 7    Number of Visits 16    Date for PT Re-Evaluation 07/05/22    Authorization Type [facility] [employer] $40 copay    PT Start Time 1100    PT Stop Time 1145    PT Time Calculation (min) 45 min    Activity Tolerance Patient tolerated treatment well             Past Medical History:  Diagnosis Date   Normocytic anemia due to blood loss 04/27/2018   Obesity    Past Surgical History:  Procedure Laterality Date   COLONOSCOPY  03/12/2020   NO PAST SURGERIES     WISDOM TOOTH EXTRACTION     Patient Active Problem List   Diagnosis Date Noted   Impingement syndrome, shoulder, right 05/04/2022   Chondromalacia of patellofemoral joint, right 05/04/2022   Normocytic anemia due to blood loss 04/27/2018   Family history of thyroid disease 04/23/2018   Vitamin D insufficiency 04/12/2016   Intermittent low back pain 04/11/2016   Dyslipidemia 10/16/2013    PCP: Samuel Bouche, NP  REFERRING PROVIDER: Dr Aundria Mems  REFERRING DIAG: impingement Rt shoulder; patellofemoral chondromalacia Rt  THERAPY DIAG:  Shoulder impingement syndrome, right  Other symptoms and signs involving the musculoskeletal system  Abnormal posture  Chondromalacia of patellofemoral joint, right  Muscle weakness (generalized)  Rationale for Evaluation and Treatment: Rehabilitation  ONSET DATE: 04/26/22 shoulder; ~03/28/21 knee   SUBJECTIVE:                                                                                                                                                                                      SUBJECTIVE STATEMENT:  Patient reports shoulder and knee are both better. No pain in the knee or shoulder. Still has the "crunching" in the knee. She no longer has  numbness in hand with shoulder stretches.  Can tell she is making progress. She is not noticing pain with exercises. Knee feels better. Was  sore following BFR. Still has "crunching" in the knee but no pain. She is using tape at home.   PERTINENT HISTORY:  Patient reports onset of Rt shoulder pain ~ 2 weeks ago with no known injury. She noticed pain in the Rt shoulder after exercise class and pain has persisted for the past 2 weeks. Pain is improving some in the past week but still painful with elevating Rt shoulder to side with rotation.  She  has Rt knee pain with certain activities including squats, ascending and descending stairs. She can hear crepitus in the knee more recently. Symptoms have been noticeable for the past 1-2 years. Now wearing a brace when she is in the gym.History of Rt shoulder pain ~ 10 years ago which resolved with rest. Denies any medical problems.   PAIN:  Are you having pain? Yes: NPRS scale: 0/10 shoulder or knee  Pain location: Rt shoulder; Rt knee  Pain description: shoulder sharp; knee dull, clicking  Aggravating factors: shoulder abduction with rotation; knee stairs, squats, lunges  Relieving factors: avoiding those activities   PRECAUTIONS: None  WEIGHT BEARING RESTRICTIONS: No  FALLS:  Has patient fallen in last 6 months? 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 miles daily; household chores    PATIENT GOALS:get rid of the pain in the shoulder and knee and keep the symptoms from returning   NEXT MD VISIT: 07/11/22  OBJECTIVE:   DIAGNOSTIC FINDINGS:  Xrays: 05/04/22 - bilat knees - No evidence of fracture, dislocation, or joint effusion. No evidence of arthropathy or other focal bone abnormality. Soft tissues are unremarkable. 05/04/22 - Rt shoulder - no evidence of fracture or dislocation; no evidence of arthropathy or other focal bone abnormality. Soft tissues  are unremarkable.  PATIENT SURVEYS:  FOTO 52; goal 56   POSTURE: Patient presents with head forward posture with increased thoracic kyphosis; shoulders rounded and elevated; scapulae abducted and rotated along the thoracic spine; head of the humerus anterior in orientation.   UPPER EXTREMITY ROM: pain and tightness Rt ROM   Active ROM Right eval Left eval  Shoulder flexion 144 151  Shoulder extension 39 45  Shoulder abduction 140 154  Shoulder adduction    Shoulder internal rotation Thumb T10 Thumb T7  Shoulder external rotation 90 90  Elbow flexion    Elbow extension    Wrist flexion    Wrist extension    Wrist ulnar deviation    Wrist radial deviation    Wrist pronation    Wrist supination    (Blank rows = not tested) LE Strength:  LE strength grossly WFL's patient has muscular imbalance with lateral quads more activated than medial Rt LE   UPPER EXTREMITY MMT: WFL's bilat shoulders except as noted; pain with resisted shoulder abduction; rotation  MMT Right eval Left eval  Shoulder flexion    Shoulder extension    Shoulder abduction 5-/5 pain   Shoulder adduction    Shoulder internal rotation 5-/5 pain    Shoulder external rotation 4+/5 pain   Middle trapezius    Lower trapezius    Elbow flexion    Elbow extension    Wrist flexion    Wrist extension    Wrist ulnar deviation    Wrist radial deviation    Wrist pronation    Wrist supination    Grip strength (lbs)    (Blank rows = not tested)  LE ROM:  WFL's  Pecs/Biceps: tight Rt > Lt   PALPATION:  Shoulder: muscular tightness pecs/biceps; upper trap; leveator; teres Rt > Lt  Knee; tight with muscular banding Rt knee lateral quad insertion at superior lateral pole of patella    TODAY'S TREATMENT:  DATE: 06/14/22  Therapeutic Exercise: UBE L4 x 4 min 2 min fwd/2 min  back Doorway stretch 3 positions 30 sec x 2 each  Shoulder flexion stretch stepping through doorway 30 sec x 3 Counter plank 60 sec x 1; 30 sec x 1 (trial of plank on extended arms and feet/extended arms and knees which created some discmfort or pain in Rt shoulder) Row blue TB 3 sec x 15  Bow ad arrow blue TB 3 sec x 10  Lat pull blue TB x 10  Wall angle (some discomfort ER ball on wall 90 deg abd x 1 min  Scap squeeze prone 10 sec x 10 (soe discomfort end of reps)  Manual Therapy: Kinesotaping 1 strip for anterior instability, 1 strip to inhibit upper trap  Neuromuscular re-ed: Postural control with exercises Therapeutic Activity: Myofacial ball release work standing for Rt shoulder girdle  Modalities:  Self Care: Avoid standing with knees hyperextended    [facility] Adult PT Treatment:                                                 DATE: 05/26/22 Therapeutic Exercise: Doorway stretch 3 positions 30 sec x 2 each  Shoulder flexion stretch stepping through doorway 30 sec x 3 Chin tuck with noodle 5 sec x 5 Scap squeeze with noodle 10 sec x 5 Scap squeeze yellow TB 3 sec x 10 x 2   W with noodle yellow B 3 sec x 10 x 2  Row blue TB 3 sec x 5  Bow ad arrow blue TB 3 sec x 10   BFR 60% occlusion = 194mmHg (max 278mmHg)  SLR in ER 5 sec hold x 30/30 sec rest quad set w/ focus medial quad 15 reps/30 sec rest/15 reps/30 sec rest/15 reps Hip flexor stretch supine thomas 30 sec x 2  Hip flexor stretch sitting 30 sec x 2   Self care:  Myofacial ball release hip flexors prone    DATE: 05/19/22 (Dry Needling)  Therapeutic Exercise: Doorway stretch 3 positions 30 sec x 2 each  Shoulder flexion stretch stepping through doorway 30 sec x 3 Chin tuck with noodle 5 sec x 5 Scap squeeze with noodle 10 sec x 5 L's with noodle x 10 Scap squeeze yellow TB 3 sec x 10 x 2   W's with noodle x 10  W with noodle yellow B 3 sec x 10 x 2  Row blue TB 3 sec x 10  Bow ad arrow blue TB 3 sec x 10   Quad set with patellar taping 5 sec x 5 SLR in ER 3 sec x 10  Wall squat with ball btn knees 10 sec x 10  Sit to stand slow stand to sit x 10  Hip adduction Rt sidelying 3 sec x 10  Manual Therapy: STM to assess response to DN and manual work Psychologist, educational  Treatment instructions: Expect mild to moderate muscle soreness. S/S of pneumothorax if dry needled over a lung field, and to seek immediate medical attention should they occur. Patient verbalized understanding of these instructions and education.  Patient Consent Given: Yes Education handout provided: Previously provided Muscles treated: Rt pecs; biceps tendon Electrical stimulation performed: No Parameters: N/A Treatment response/outcome: decreased palpable tightness  Taping Rt knee to improve patellar alignment Rock tape  Deep tissue work- transverse friction  massage Lateral Rt quad at patellar insertion ~ 1 min 2x/day Neuromuscular re-ed: Activation of medial quad Therapeutic Activity: Myofacial ball release work standing for Rt shoulder girdle  Modalities:  Self Care: Avoid standing with knees hyperextended    PATIENT EDUCATION: Education details: POC; HEP  Person educated: Patient Education method: Explanation, Demonstration, Tactile cues, Verbal cues, and Handouts Education comprehension: verbalized understanding, returned demonstration, verbal cues required, tactile cues required, and needs further education  HOME EXERCISE PROGRAM: Access Code: [email or web address] URL: [email or web address] Date: 06/14/2022 Prepared by: Gillermo Murdoch  Program Notes Avoid locking knees when standing nerve stretch lying on back - tuck chin, tip and turn head to left, right arm out to side in comfortable position mild stretch hold 1 min x 2 x twice a day   Exercises - Seated Cervical Retraction  - 3 x daily - 7 x weekly - 1 sets - 10 reps - Standing Scapular Retraction  - 3 x daily - 7 x weekly - 1 sets - 10 reps -  10 hold - Shoulder External Rotation and Scapular Retraction  - 3 x daily - 7 x weekly - 1 sets - 10 reps - 3-5 sec   hold - Shoulder External Rotation and Scapular Retraction with Resistance  - 2 x daily - 7 x weekly - 1 sets - 10 reps - 3-5 sec  hold - Shoulder External Rotation in 45 Degrees Abduction  - 2 x daily - 7 x weekly - 1-2 sets - 10 reps - 3 sec  hold - Doorway Pec Stretch at 60 Degrees Abduction  - 3 x daily - 7 x weekly - 1 sets - 3 reps - Shoulder W - External Rotation with Resistance  - 2 x daily - 7 x weekly - 1-2 sets - 10 reps - 3 sec  hold - Seated Cervical Sidebending AROM  - 2 x daily - 7 x weekly - 1 sets - 3-5 reps - 5-10 sec  hold - Standing Backward Shoulder Rolls  - 2 x daily - 7 x weekly - 1 sets - 10 reps - 1-2 sec  hold - Doorway Pec Stretch at 90 Degrees Abduction  - 3 x daily - 7 x weekly - 1 sets - 3 reps - 30 seconds  hold - Doorway Pec Stretch at 120 Degrees Abduction  - 3 x daily - 7 x weekly - 1 sets - 3 reps - 30 second hold  hold - Prone Quadriceps Stretch with Strap  - 2 x daily - 7 x weekly - 1 sets - 3 reps - 30 sec  hold - Supine Piriformis Stretch with Leg Straight  - 2 x daily - 7 x weekly - 1 sets - 3 reps - 30 sec  hold - Supine ITB Stretch with Strap  - 2 x daily - 7 x weekly - 1 sets - 3 reps - 30 sec  hold - Supine Quad Set  - 2 x daily - 7 x weekly - 1 sets - 10 reps - 3 sec  hold - Straight Leg Raise with External Rotation  - 2 x daily - 7 x weekly - 1 sets - 10 reps - 3-5 sec  hold - Wall Squat with Ball between Knees  - 2 x daily - 7 x weekly - 1-2 sets - 10 reps - 10 sec  hold - Standing Bicep Stretch at Wall  - 2 x daily - 7 x weekly - 1 sets - 3 reps -  30 sec  hold - Standing Shoulder Flexion Stretch on Wall  - 2 x daily - 7 x weekly - 2 sets - 3 reps - 10 sec  hold - Sidelying Hip Adduction  - 2 x daily - 7 x weekly - 2-3 sets - 10 reps - 3-5 sec  hold - Sit to Stand  - 2 x daily - 7 x weekly - 1 sets - 10 reps - 3-5 sec  hold - Standing  Bilateral Low Shoulder Row with Anchored Resistance  - 2 x daily - 7 x weekly - 1-3 sets - 10 reps - 2-3 sec  hold - Drawing Bow  - 1 x daily - 7 x weekly - 1 sets - 10 reps - 3 sec  hold - Supine Lower Trunk Rotation  - 2 x daily - 7 x weekly - 1 sets - 3-5 reps - 20-30 sec  hold - Glute Med Wall Lean  - 2 x daily - 7 x weekly - 1-2 sets - 10 reps - 3-5 sec  hold - Sidelying Hip Abduction  - 1 x daily - 7 x weekly - 3 sets - 10 reps - 3-5 sec  hold - 3-Way Lunge on Slider  - 1 x daily - 7 x weekly - 1 sets - 10 reps - Step Up  - 2 x daily - 7 x weekly - 1 sets - 10 reps - 2 sec  hold - Standing Shoulder External Rotation with Resistance at 45 Degrees of Abduction  - 1 x daily - 7 x weekly - 1-2 sets - 10 reps - 3 sec  hold - Hip Flexor Stretch at Edge of Bed  - 2 x daily - 7 x weekly - 1 sets - 3 reps - 30 sec  hold - Seated Hip Flexor Stretch  - 2 x daily - 7 x weekly - 1 sets - 3 reps - 30 sec  hold - Plank with Elbows on Table  - 2 x daily - 7 x weekly - 1 sets - 3 reps - 30 sec  hold - Standing Lat Pull Down with Resistance - Elbows Bent  - 2 x daily - 7 x weekly - 1 sets - 10 reps - 3 sec  hold - Wall Angels  - 2 x daily - 7 x weekly - 1 sets - 10 reps - 2-3 sec  hold - Prone Scapular Retraction  - 2 x daily - 7 x weekly - 1 sets - 5-10 reps - 3-5 sec  hold  Patient Education - Office Posture  ASSESSMENT:  CLINICAL IMPRESSION: Patient reports no Rt shoulder pain or knee pain with exercises and less tightness in the front of shoulder with stretches; no UE intermittent numbness with stretch. Tolerated BFR Rt with some soreness but no pain. Focus on shoulder and scapular strength and stabilization. Doing exercises at her gym without knee pain. Reviewed and progressed with exercises.     OBJECTIVE IMPAIRMENTS: Rt shoulder pain for the past 2 weeks with no known injury but does report similar episode of Rt shoulder pain ~ 10 years ago which resolved with rest. She has poor posture and  alignment; limited Rt > Lt shoulder ROM; poor muscular balance; poor scapular control for shoulder elevation; muscular tightness with palpation; pain with functional activities. Patient reports pain in Rt knee for 1-2 years with no known injury. She stands with bilat knees in hyperextension; muscular tightness to palpation lateral Rt quad to insertion of  quad at superior lateral patella; pain with patella grind; pain with ascending and descending steps; squats; lunges  GOALS: Goals reviewed with patient? Yes  SHORT TERM GOALS: Target date: 06/07/2022   Independent in initial HEP  Baseline: Goal status: INITIAL  2.  Demonstration and verbalization of improved posture and alignment to decrease postural infulcnces for shoulder pain  Baseline:  Goal status: INITIAL  3.  Avoid hyperextension of knees in standing  Baseline:  Goal status: INITIAL   LONG TERM GOALS: Target date: 07/05/2022  Decrease pain in Rt shoulder and Rt knee by 75-100% allowing patient to participate in all exercise and functional activities without pain  Baseline:  Goal status: INITIAL  2.  Increase strength Rt shoulder to 5/5 with no pain  Baseline:  Goal status: INITIAL  3.  Improve posture and alignment with patient to demonstrate improved activation of posterior shoulder girdle with functional use of Rt UE Baseline:  Goal status: INITIAL  4.  Patient to tolerate ascending and descending steps; squats and lunges in the gym with no Rt knee pain  Baseline:  Goal status: INITIAL  5.  Independent in HEP  Baseline:  Goal status: INITIAL  6.  Functional limitation score 74  Baseline:  Goal status: INITIAL  PLAN:  PT FREQUENCY: 2x/week  PT DURATION: 8 weeks  PLANNED INTERVENTIONS: Therapeutic exercises, Therapeutic activity, Neuromuscular re-education, Balance training, Gait training, Patient/Family education, Self Care, Joint mobilization, Stair training, Aquatic Therapy, Dry Needling, Electrical  stimulation, Spinal mobilization, Cryotherapy, Moist heat, Taping, Vasopneumatic device, Ultrasound, Ionotophoresis 4mg /ml Dexamethasone, Manual therapy, and Re-evaluation  PLAN FOR NEXT SESSION: review and progress exercises; continue with postural correction and education; manual work, DN, modalities as indicated.    Everardo All, PT 06/14/2022, 11:48 AM

## 2022-06-15 ENCOUNTER — Ambulatory Visit: Payer: 59 | Admitting: Sports Medicine

## 2022-06-21 ENCOUNTER — Ambulatory Visit: Payer: 59 | Admitting: Rehabilitative and Restorative Service Providers"

## 2022-06-22 ENCOUNTER — Ambulatory Visit: Payer: 59

## 2022-06-22 DIAGNOSIS — R293 Abnormal posture: Secondary | ICD-10-CM

## 2022-06-22 DIAGNOSIS — M2241 Chondromalacia patellae, right knee: Secondary | ICD-10-CM

## 2022-06-22 DIAGNOSIS — M6281 Muscle weakness (generalized): Secondary | ICD-10-CM

## 2022-06-22 DIAGNOSIS — M7541 Impingement syndrome of right shoulder: Secondary | ICD-10-CM

## 2022-06-22 DIAGNOSIS — R29898 Other symptoms and signs involving the musculoskeletal system: Secondary | ICD-10-CM | POA: Diagnosis not present

## 2022-06-22 NOTE — Therapy (Signed)
OUTPATIENT PHYSICAL THERAPY SHOULDER AND KNEE TREATMENT    Patient Name: Joanna Hayes MRN: [id number] DOB:1975/12/31, 47 y.o., female Today's Date: 06/22/2022  END OF SESSION:  PT End of Session - 06/22/22 1402     Visit Number 8    Number of Visits 16    Date for PT Re-Evaluation 07/05/22    Authorization Type [facility] [employer] $40 copay    PT Start Time [id number]    PT Stop Time 1445    PT Time Calculation (min) 42 min    Activity Tolerance Patient tolerated treatment well    Behavior During Therapy [facility] for tasks assessed/performed             Past Medical History:  Diagnosis Date   Normocytic anemia due to blood loss 04/27/2018   Obesity    Past Surgical History:  Procedure Laterality Date   COLONOSCOPY  03/12/2020   NO PAST SURGERIES     WISDOM TOOTH EXTRACTION     Patient Active Problem List   Diagnosis Date Noted   Impingement syndrome, shoulder, right 05/04/2022   Chondromalacia of patellofemoral joint, right 05/04/2022   Normocytic anemia due to blood loss 04/27/2018   Family history of thyroid disease 04/23/2018   Vitamin D insufficiency 04/12/2016   Intermittent low back pain 04/11/2016   Dyslipidemia 10/16/2013    PCP: Samuel Bouche, NP  REFERRING PROVIDER: Dr Aundria Mems  REFERRING DIAG: impingement Rt shoulder; patellofemoral chondromalacia Rt  THERAPY DIAG:  Shoulder impingement syndrome, right  Other symptoms and signs involving the musculoskeletal system  Abnormal posture  Muscle weakness (generalized)  Chondromalacia of patellofemoral joint, right  Rationale for Evaluation and Treatment: Rehabilitation  ONSET DATE: 04/26/22 shoulder; ~03/28/21 knee   SUBJECTIVE:                                                                                                                                                                                      SUBJECTIVE STATEMENT:  Patient reports her shoulder and knee feel better, however she continues to  have discomfort in knee with "crunching" sound when navigating stairs and working out (I.e. lunges, squats).     PERTINENT HISTORY:  Patient reports onset of Rt shoulder pain ~ 2 weeks ago with no known injury. She noticed pain in the Rt shoulder after exercise class and pain has persisted for the past 2 weeks. Pain is improving some in the past week but still painful with elevating Rt shoulder to side with rotation.  She has Rt knee pain with certain activities including squats, ascending and descending stairs. She can hear crepitus in the knee more recently. Symptoms have been noticeable for the past  1-2 years. Now wearing a brace when she is in the gym.History of Rt shoulder pain ~ 10 years ago which resolved with rest. Denies any medical problems.   PAIN:  Are you having pain? Yes: NPRS scale: 0/10 shoulder or knee  Pain location: Rt shoulder; Rt knee  Pain description: shoulder sharp; knee dull, clicking  Aggravating factors: shoulder abduction with rotation; knee stairs, squats, lunges  Relieving factors: avoiding those activities   PRECAUTIONS: None  WEIGHT BEARING RESTRICTIONS: No  FALLS:  Has patient fallen in last 6 months? 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 miles daily; household chores    PATIENT GOALS:get rid of the pain in the shoulder and knee and keep the symptoms from returning   NEXT MD VISIT: 07/11/22  OBJECTIVE:   DIAGNOSTIC FINDINGS:  Xrays: 05/04/22 - bilat knees - No evidence of fracture, dislocation, or joint effusion. No evidence of arthropathy or other focal bone abnormality. Soft tissues are unremarkable. 05/04/22 - Rt shoulder - no evidence of fracture or dislocation; no evidence of arthropathy or other focal bone abnormality. Soft tissues are unremarkable.  PATIENT SURVEYS:  FOTO 52; goal 74 06/22/22: 71   POSTURE: Patient presents with head forward  posture with increased thoracic kyphosis; shoulders rounded and elevated; scapulae abducted and rotated along the thoracic spine; head of the humerus anterior in orientation.   UPPER EXTREMITY ROM: pain and tightness Rt ROM   Active ROM Right eval Left eval  Shoulder flexion 144 151  Shoulder extension 39 45  Shoulder abduction 140 154  Shoulder adduction    Shoulder internal rotation Thumb T10 Thumb T7  Shoulder external rotation 90 90  Elbow flexion    Elbow extension    Wrist flexion    Wrist extension    Wrist ulnar deviation    Wrist radial deviation    Wrist pronation    Wrist supination    (Blank rows = not tested) LE Strength:  LE strength grossly WFL's patient has muscular imbalance with lateral quads more activated than medial Rt LE   UPPER EXTREMITY MMT: WFL's bilat shoulders except as noted; pain with resisted shoulder abduction; rotation  MMT Right eval Left eval  Shoulder flexion    Shoulder extension    Shoulder abduction 5-/5 pain   Shoulder adduction    Shoulder internal rotation 5-/5 pain    Shoulder external rotation 4+/5 pain   Middle trapezius    Lower trapezius    Elbow flexion    Elbow extension    Wrist flexion    Wrist extension    Wrist ulnar deviation    Wrist radial deviation    Wrist pronation    Wrist supination    Grip strength (lbs)    (Blank rows = not tested)  LE ROM:  WFL's  Pecs/Biceps: tight Rt > Lt   PALPATION:  Shoulder: muscular tightness pecs/biceps; upper trap; leveator; teres Rt > Lt  Knee; tight with muscular banding Rt knee lateral quad insertion at superior lateral pole of patella    TODAY'S TREATMENT:         [facility] Adult PT Treatment:                                                DATE:  06/22/2022 Therapeutic Exercise: UBE L4 x 5 min 2 min fwd/2 min back Doorway stretch 3 positions x30" each Rows BTB 10x3"  Incline plank on mat table x30" --> slow mountain climber x10 Bent arm shoulder flexion arm raises  light green loop band  Arm wall clock light green loop band R pec stretch in doorway Drawing the bow GTB x10 B SA arm wall slides with foam roller + FOTO intake Supine R shoulder AROM IR/ER 1#DB Shoulder ER isometric walk out (R) BTB Seated shoulder ER in abd YTB (elbow propped on FR) 2x10 Posterior lunge with slider x5 (discomfort in M/L knee) R woodpecker x10 --> added trunk rotation x10                                                                                                                                    DATE: 06/14/22  Therapeutic Exercise: UBE L4 x 4 min 2 min fwd/2 min back Doorway stretch 3 positions 30 sec x 2 each  Shoulder flexion stretch stepping through doorway 30 sec x 3 Counter plank 60 sec x 1; 30 sec x 1 (trial of plank on extended arms and feet/extended arms and knees which created some discmfort or pain in Rt shoulder) Row blue TB 3 sec x 15  Bow ad arrow blue TB 3 sec x 10  Lat pull blue TB x 10  Wall angle (some discomfort ER ball on wall 90 deg abd x 1 min  Scap squeeze prone 10 sec x 10 (soe discomfort end of reps)  Manual Therapy: Kinesotaping 1 strip for anterior instability, 1 strip to inhibit upper trap  Neuromuscular re-ed: Postural control with exercises Therapeutic Activity: Myofacial ball release work standing for Rt shoulder girdle  Modalities:  Self Care: Avoid standing with knees hyperextended    [facility] Adult PT Treatment:                                                 DATE: 05/26/22 Therapeutic Exercise: Doorway stretch 3 positions 30 sec x 2 each  Shoulder flexion stretch stepping through doorway 30 sec x 3 Chin tuck with noodle 5 sec x 5 Scap squeeze with noodle 10 sec x 5 Scap squeeze yellow TB 3 sec x 10 x 2   W with noodle yellow B 3 sec x 10 x 2  Row blue TB 3 sec x 5  Bow ad arrow blue TB 3 sec x 10   BFR 60% occlusion = 153mmHg (max 264mmHg)  SLR in ER 5 sec hold x 30/30 sec rest quad set w/ focus medial quad 15  reps/30 sec rest/15 reps/30 sec rest/15 reps Hip flexor stretch supine thomas 30 sec x 2  Hip flexor stretch sitting 30 sec x 2   Self care:  Myofacial ball release hip flexors prone    DATE: 05/19/22 (Dry Needling)  Therapeutic Exercise: Doorway stretch 3 positions 30 sec x 2 each  Shoulder flexion stretch stepping through doorway 30 sec x 3 Chin tuck with noodle 5 sec x 5 Scap squeeze with noodle 10 sec x 5 L's with noodle x 10 Scap squeeze yellow TB 3 sec x 10 x 2   W's with noodle x 10  W with noodle yellow B 3 sec x 10 x 2  Row blue TB 3 sec x 10  Bow ad arrow blue TB 3 sec x 10  Quad set with patellar taping 5 sec x 5 SLR in ER 3 sec x 10  Wall squat with ball btn knees 10 sec x 10  Sit to stand slow stand to sit x 10  Hip adduction Rt sidelying 3 sec x 10  Manual Therapy: STM to assess response to DN and manual work Psychologist, educational  Treatment instructions: Expect mild to moderate muscle soreness. S/S of pneumothorax if dry needled over a lung field, and to seek immediate medical attention should they occur. Patient verbalized understanding of these instructions and education.  Patient Consent Given: Yes Education handout provided: Previously provided Muscles treated: Rt pecs; biceps tendon Electrical stimulation performed: No Parameters: N/A Treatment response/outcome: decreased palpable tightness  Taping Rt knee to improve patellar alignment Rock tape  Deep tissue work- transverse friction massage Lateral Rt quad at patellar insertion ~ 1 min 2x/day Neuromuscular re-ed: Activation of medial quad Therapeutic Activity: Myofacial ball release work standing for Rt shoulder girdle  Modalities:  Self Care: Avoid standing with knees hyperextended    PATIENT EDUCATION: Education details: POC; HEP  Person educated: Patient Education method: Explanation, Demonstration, Tactile cues, Verbal cues, and Handouts Education comprehension: verbalized  understanding, returned demonstration, verbal cues required, tactile cues required, and needs further education  HOME EXERCISE PROGRAM: Access Code: [place] URL: [email or web address] Date: 06/14/2022 Prepared by: Gillermo Murdoch  Program Notes Avoid locking knees when standing nerve stretch lying on back - tuck chin, tip and turn head to left, right arm out to side in comfortable position mild stretch hold 1 min x 2 x twice a day   Exercises - Seated Cervical Retraction  - 3 x daily - 7 x weekly - 1 sets - 10 reps - Standing Scapular Retraction  - 3 x daily - 7 x weekly - 1 sets - 10 reps - 10 hold - Shoulder External Rotation and Scapular Retraction  - 3 x daily - 7 x weekly - 1 sets - 10 reps - 3-5 sec   hold - Shoulder External Rotation and Scapular Retraction with Resistance  - 2 x daily - 7 x weekly - 1 sets - 10 reps - 3-5 sec  hold - Shoulder External Rotation in 45 Degrees Abduction  - 2 x daily - 7 x weekly - 1-2 sets - 10 reps - 3 sec  hold - Doorway Pec Stretch at 60 Degrees Abduction  - 3 x daily - 7 x weekly - 1 sets - 3 reps - Shoulder W - External Rotation with Resistance  - 2 x daily - 7 x weekly - 1-2 sets - 10 reps - 3 sec  hold - Seated Cervical Sidebending AROM  - 2 x daily - 7 x weekly - 1 sets - 3-5 reps - 5-10 sec  hold - Standing Backward Shoulder Rolls  - 2 x daily - 7 x weekly - 1  sets - 10 reps - 1-2 sec  hold - Doorway Pec Stretch at 90 Degrees Abduction  - 3 x daily - 7 x weekly - 1 sets - 3 reps - 30 seconds  hold - Doorway Pec Stretch at 120 Degrees Abduction  - 3 x daily - 7 x weekly - 1 sets - 3 reps - 30 second hold  hold - Prone Quadriceps Stretch with Strap  - 2 x daily - 7 x weekly - 1 sets - 3 reps - 30 sec  hold - Supine Piriformis Stretch with Leg Straight  - 2 x daily - 7 x weekly - 1 sets - 3 reps - 30 sec  hold - Supine ITB Stretch with Strap  - 2 x daily - 7 x weekly - 1 sets - 3 reps - 30 sec  hold - Supine Quad Set  - 2 x daily - 7 x  weekly - 1 sets - 10 reps - 3 sec  hold - Straight Leg Raise with External Rotation  - 2 x daily - 7 x weekly - 1 sets - 10 reps - 3-5 sec  hold - Wall Squat with Ball between Knees  - 2 x daily - 7 x weekly - 1-2 sets - 10 reps - 10 sec  hold - Standing Bicep Stretch at Wall  - 2 x daily - 7 x weekly - 1 sets - 3 reps - 30 sec  hold - Standing Shoulder Flexion Stretch on Wall  - 2 x daily - 7 x weekly - 2 sets - 3 reps - 10 sec  hold - Sidelying Hip Adduction  - 2 x daily - 7 x weekly - 2-3 sets - 10 reps - 3-5 sec  hold - Sit to Stand  - 2 x daily - 7 x weekly - 1 sets - 10 reps - 3-5 sec  hold - Standing Bilateral Low Shoulder Row with Anchored Resistance  - 2 x daily - 7 x weekly - 1-3 sets - 10 reps - 2-3 sec  hold - Drawing Bow  - 1 x daily - 7 x weekly - 1 sets - 10 reps - 3 sec  hold - Supine Lower Trunk Rotation  - 2 x daily - 7 x weekly - 1 sets - 3-5 reps - 20-30 sec  hold - Glute Med Wall Lean  - 2 x daily - 7 x weekly - 1-2 sets - 10 reps - 3-5 sec  hold - Sidelying Hip Abduction  - 1 x daily - 7 x weekly - 3 sets - 10 reps - 3-5 sec  hold - 3-Way Lunge on Slider  - 1 x daily - 7 x weekly - 1 sets - 10 reps - Step Up  - 2 x daily - 7 x weekly - 1 sets - 10 reps - 2 sec  hold - Standing Shoulder External Rotation with Resistance at 45 Degrees of Abduction  - 1 x daily - 7 x weekly - 1-2 sets - 10 reps - 3 sec  hold - Hip Flexor Stretch at Edge of Bed  - 2 x daily - 7 x weekly - 1 sets - 3 reps - 30 sec  hold - Seated Hip Flexor Stretch  - 2 x daily - 7 x weekly - 1 sets - 3 reps - 30 sec  hold - Plank with Elbows on Table  - 2 x daily - 7 x weekly - 1 sets - 3 reps -  30 sec  hold - Standing Lat Pull Down with Resistance - Elbows Bent  - 2 x daily - 7 x weekly - 1 sets - 10 reps - 3 sec  hold - Wall Angels  - 2 x daily - 7 x weekly - 1 sets - 10 reps - 2-3 sec  hold - Prone Scapular Retraction  - 2 x daily - 7 x weekly - 1 sets - 5-10 reps - 3-5 sec  hold  Patient Education - Office  Posture  ASSESSMENT:  CLINICAL IMPRESSION:  Tactile cues provided to improve scapular depression/retraction activation and engagement during postural strengthening exercises. Woodpecker exercise added end of session to progress glute activation; trunk rotation added to challenge knee stability.   OBJECTIVE IMPAIRMENTS: Rt shoulder pain for the past 2 weeks with no known injury but does report similar episode of Rt shoulder pain ~ 10 years ago which resolved with rest. She has poor posture and alignment; limited Rt > Lt shoulder ROM; poor muscular balance; poor scapular control for shoulder elevation; muscular tightness with palpation; pain with functional activities. Patient reports pain in Rt knee for 1-2 years with no known injury. She stands with bilat knees in hyperextension; muscular tightness to palpation lateral Rt quad to insertion of quad at superior lateral patella; pain with patella grind; pain with ascending and descending steps; squats; lunges  GOALS: Goals reviewed with patient? Yes  SHORT TERM GOALS: Target date: 06/07/2022   Independent in initial HEP  Baseline: Goal status: MET  2.  Demonstration and verbalization of improved posture and alignment to decrease postural infulcnces for shoulder pain  Baseline:  Goal status: MET  3.  Avoid hyperextension of knees in standing  Baseline:  Goal status: MET   LONG TERM GOALS: Target date: 07/05/2022  Decrease pain in Rt shoulder and Rt knee by 75-100% allowing patient to participate in all exercise and functional activities without pain  Baseline:  Goal status: INITIAL  2.  Increase strength Rt shoulder to 5/5 with no pain  Baseline:  Goal status: INITIAL  3.  Improve posture and alignment with patient to demonstrate improved activation of posterior shoulder girdle with functional use of Rt UE Baseline:  Goal status: INITIAL  4.  Patient to tolerate ascending and descending steps; squats and lunges in the gym with no  Rt knee pain  Baseline:  Goal status: INITIAL  5.  Independent in HEP  Baseline:  Goal status: INITIAL  6.  Functional limitation score 74  Baseline:  Goal status: INITIAL  PLAN:  PT FREQUENCY: 2x/week  PT DURATION: 8 weeks  PLANNED INTERVENTIONS: Therapeutic exercises, Therapeutic activity, Neuromuscular re-education, Balance training, Gait training, Patient/Family education, Self Care, Joint mobilization, Stair training, Aquatic Therapy, Dry Needling, Electrical stimulation, Spinal mobilization, Cryotherapy, Moist heat, Taping, Vasopneumatic device, Ultrasound, Ionotophoresis 4mg /ml Dexamethasone, Manual therapy, and Re-evaluation  PLAN FOR NEXT SESSION: review and progress exercises; continue with postural correction and education; manual work, DN, modalities as indicated.    Hardin Negus, PTA 06/22/2022, 2:46 PM

## 2022-06-28 ENCOUNTER — Ambulatory Visit: Payer: 59 | Attending: Sports Medicine | Admitting: Rehabilitative and Restorative Service Providers"

## 2022-06-28 ENCOUNTER — Encounter: Payer: Self-pay | Admitting: Rehabilitative and Restorative Service Providers"

## 2022-06-28 DIAGNOSIS — M7541 Impingement syndrome of right shoulder: Secondary | ICD-10-CM

## 2022-06-28 DIAGNOSIS — M2241 Chondromalacia patellae, right knee: Secondary | ICD-10-CM | POA: Diagnosis present

## 2022-06-28 DIAGNOSIS — M6281 Muscle weakness (generalized): Secondary | ICD-10-CM

## 2022-06-28 DIAGNOSIS — R293 Abnormal posture: Secondary | ICD-10-CM | POA: Insufficient documentation

## 2022-06-28 DIAGNOSIS — R29898 Other symptoms and signs involving the musculoskeletal system: Secondary | ICD-10-CM | POA: Diagnosis present

## 2022-06-28 NOTE — Therapy (Signed)
OUTPATIENT PHYSICAL THERAPY SHOULDER AND KNEE TREATMENT    Patient Name: Joanna Hayes MRN: [id number] DOB:[birth year], 47 y.o., female Today's Date: 06/28/2022  END OF SESSION:  PT End of Session - 06/28/22 0823     Visit Number 9    Number of Visits 16    Date for PT Re-Evaluation 07/05/22    Authorization Type [facility] [employer] $40 copay    PT Start Time 0801    PT Stop Time 0845    PT Time Calculation (min) 44 min    Activity Tolerance Patient tolerated treatment well             Past Medical History:  Diagnosis Date   Normocytic anemia due to blood loss 04/27/2018   Obesity    Past Surgical History:  Procedure Laterality Date   COLONOSCOPY  03/12/2020   NO PAST SURGERIES     WISDOM TOOTH EXTRACTION     Patient Active Problem List   Diagnosis Date Noted   Impingement syndrome, shoulder, right 05/04/2022   Chondromalacia of patellofemoral joint, right 05/04/2022   Normocytic anemia due to blood loss 04/27/2018   Family history of thyroid disease 04/23/2018   Vitamin D insufficiency 04/12/2016   Intermittent low back pain 04/11/2016   Dyslipidemia 10/16/2013    PCP: Samuel Bouche, NP  REFERRING PROVIDER: Dr Aundria Mems  REFERRING DIAG: impingement Rt shoulder; patellofemoral chondromalacia Rt  THERAPY DIAG:  Shoulder impingement syndrome, right  Other symptoms and signs involving the musculoskeletal system  Abnormal posture  Muscle weakness (generalized)  Chondromalacia of patellofemoral joint, right  Rationale for Evaluation and Treatment: Rehabilitation  ONSET DATE: 04/26/22 shoulder; ~03/28/21 knee   SUBJECTIVE:                                                                                                                                                                                      SUBJECTIVE STATEMENT:  Patient reports her shoulder and knee feel better, however she continues to have discomfort in knee with "clicking" sound when navigating  stairs and working out (I.e. lunges, squats).     PERTINENT HISTORY:  Patient reports onset of Rt shoulder pain ~ 2 weeks ago with no known injury. She noticed pain in the Rt shoulder after exercise class and pain has persisted for the past 2 weeks. Pain is improving some in the past week but still painful with elevating Rt shoulder to side with rotation.  She has Rt knee pain with certain activities including squats, ascending and descending stairs. She can hear crepitus in the knee more recently. Symptoms have been noticeable for the past 1-2 years. Now wearing a brace when she is in  the gym.History of Rt shoulder pain ~ 10 years ago which resolved with rest. Denies any medical problems.   PAIN:  Are you having pain? Yes: NPRS scale: 0/10 shoulder or knee  Knee pain 3/10 with stairs, squats, lunges  Pain location: Rt shoulder; Rt knee  Pain description: shoulder sharp; knee dull, clicking  Aggravating factors: shoulder abduction with rotation; knee stairs, squats, lunges  Relieving factors: avoiding those activities   PRECAUTIONS: None  WEIGHT BEARING RESTRICTIONS: No  FALLS:  Has patient fallen in last 6 months? 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 miles daily; household chores    PATIENT GOALS:get rid of the pain in the shoulder and knee and keep the symptoms from returning   NEXT MD VISIT: 07/11/22  OBJECTIVE:   DIAGNOSTIC FINDINGS:  Xrays: 05/04/22 - bilat knees - No evidence of fracture, dislocation, or joint effusion. No evidence of arthropathy or other focal bone abnormality. Soft tissues are unremarkable. 05/04/22 - Rt shoulder - no evidence of fracture or dislocation; no evidence of arthropathy or other focal bone abnormality. Soft tissues are unremarkable.  PATIENT SURVEYS:  FOTO 52; goal 74 06/22/22: 71   POSTURE: Patient presents with head forward posture with  increased thoracic kyphosis; shoulders rounded and elevated; scapulae abducted and rotated along the thoracic spine; head of the humerus anterior in orientation.   UPPER EXTREMITY ROM: pain and tightness Rt ROM   Active ROM Right eval Left eval  Shoulder flexion 144 151  Shoulder extension 39 45  Shoulder abduction 140 154  Shoulder adduction    Shoulder internal rotation Thumb T10 Thumb T7  Shoulder external rotation 90 90  Elbow flexion    Elbow extension    Wrist flexion    Wrist extension    Wrist ulnar deviation    Wrist radial deviation    Wrist pronation    Wrist supination    (Blank rows = not tested) LE Strength:  LE strength grossly WFL's patient has muscular imbalance with lateral quads more activated than medial Rt LE   UPPER EXTREMITY MMT: WFL's bilat shoulders except as noted; pain with resisted shoulder abduction; rotation  MMT Right eval Left eval  Shoulder flexion    Shoulder extension    Shoulder abduction 5-/5 pain   Shoulder adduction    Shoulder internal rotation 5-/5 pain    Shoulder external rotation 4+/5 pain   Middle trapezius    Lower trapezius    Elbow flexion    Elbow extension    Wrist flexion    Wrist extension    Wrist ulnar deviation    Wrist radial deviation    Wrist pronation    Wrist supination    Grip strength (lbs)    (Blank rows = not tested)  LE ROM:  WFL's  Pecs/Biceps: tight Rt > Lt   PALPATION:  Shoulder: muscular tightness pecs/biceps; upper trap; leveator; teres Rt > Lt  Knee; tight with muscular banding Rt knee lateral quad insertion at superior lateral pole of patella    TODAY'S TREATMENT:      [facility] Adult PT Treatment:                                                DATE: 06/28/2022 Therapeutic Exercise:  Treadmill 2.5 mph x 6 min 3 degree incline   Blood flow restriction Rt proximal thigh  120 hg pressure; wall squat 30/15/15/15 with 30 sec rest btn sets  BFR 60% occlusion = 132mmHg (max 236mmHg)    Elevated Rear foot lunge at steps 3-5 sec hold x 10 Rt/Lt; x 10 Rt  Squat with strap behind knees for sit back to increase work through [employer] 3 sec x 10 x 2   [facility] Adult PT Treatment:                                                DATE: 06/22/2022 Therapeutic Exercise: UBE L4 x 5 min 2 min fwd/2 min back Doorway stretch 3 positions x30" each Rows BTB 10x3"  Incline plank on mat table x30" --> slow mountain climber x10 Bent arm shoulder flexion arm raises light green loop band  Arm wall clock light green loop band R pec stretch in doorway Drawing the bow GTB x10 B SA arm wall slides with foam roller + FOTO intake Supine R shoulder AROM IR/ER 1#DB Shoulder ER isometric walk out (R) BTB Seated shoulder ER in abd YTB (elbow propped on FR) 2x10 Posterior lunge with slider x5 (discomfort in M/L knee) R woodpecker x10 --> added trunk rotation x10                                                                                                                                    DATE: 06/14/22  Therapeutic Exercise: UBE L4 x 4 min 2 min fwd/2 min back Doorway stretch 3 positions 30 sec x 2 each  Shoulder flexion stretch stepping through doorway 30 sec x 3 Counter plank 60 sec x 1; 30 sec x 1 (trial of plank on extended arms and feet/extended arms and knees which created some discmfort or pain in Rt shoulder) Row blue TB 3 sec x 15  Bow ad arrow blue TB 3 sec x 10  Lat pull blue TB x 10  Wall angle (some discomfort ER ball on wall 90 deg abd x 1 min  Scap squeeze prone 10 sec x 10 (soe discomfort end of reps)  Manual Therapy: Kinesotaping 1 strip for anterior instability, 1 strip to inhibit upper trap  Neuromuscular re-ed: Postural control with exercises Therapeutic Activity: Myofacial ball release work standing for Rt shoulder girdle  Modalities:  Self Care: Avoid standing with knees hyperextended    [facility] Adult PT Treatment:                                                 DATE:  05/26/22 Therapeutic Exercise: Doorway stretch 3 positions 30  sec x 2 each  Shoulder flexion stretch stepping through doorway 30 sec x 3 Chin tuck with noodle 5 sec x 5 Scap squeeze with noodle 10 sec x 5 Scap squeeze yellow TB 3 sec x 10 x 2   W with noodle yellow B 3 sec x 10 x 2  Row blue TB 3 sec x 5  Bow ad arrow blue TB 3 sec x 10   BFR 60% occlusion = 142mmHg (max 236mmHg)  SLR in ER 5 sec hold x 30/30 sec rest quad set w/ focus medial quad 15 reps/30 sec rest/15 reps/30 sec rest/15 reps Hip flexor stretch supine thomas 30 sec x 2  Hip flexor stretch sitting 30 sec x 2   Self care:  Myofacial ball release hip flexors prone    DATE: 05/19/22 (Dry Needling)  Therapeutic Exercise: Doorway stretch 3 positions 30 sec x 2 each  Shoulder flexion stretch stepping through doorway 30 sec x 3 Chin tuck with noodle 5 sec x 5 Scap squeeze with noodle 10 sec x 5 L's with noodle x 10 Scap squeeze yellow TB 3 sec x 10 x 2   W's with noodle x 10  W with noodle yellow B 3 sec x 10 x 2  Row blue TB 3 sec x 10  Bow ad arrow blue TB 3 sec x 10  Quad set with patellar taping 5 sec x 5 SLR in ER 3 sec x 10  Wall squat with ball btn knees 10 sec x 10  Sit to stand slow stand to sit x 10  Hip adduction Rt sidelying 3 sec x 10  Manual Therapy: STM to assess response to DN and manual work Psychologist, educational  Treatment instructions: Expect mild to moderate muscle soreness. S/S of pneumothorax if dry needled over a lung field, and to seek immediate medical attention should they occur. Patient verbalized understanding of these instructions and education.  Patient Consent Given: Yes Education handout provided: Previously provided Muscles treated: Rt pecs; biceps tendon Electrical stimulation performed: No Parameters: N/A Treatment response/outcome: decreased palpable tightness  Taping Rt knee to improve patellar alignment Rock tape  Deep tissue work- transverse friction massage Lateral  Rt quad at patellar insertion ~ 1 min 2x/day Neuromuscular re-ed: Activation of medial quad Therapeutic Activity: Myofacial ball release work standing for Rt shoulder girdle  Modalities:  Self Care: Avoid standing with knees hyperextended    PATIENT EDUCATION: Education details: POC; HEP  Person educated: Patient Education method: Explanation, Demonstration, Tactile cues, Verbal cues, and Handouts Education comprehension: verbalized understanding, returned demonstration, verbal cues required, tactile cues required, and needs further education  HOME EXERCISE PROGRAM: Access Code: Leonard URL: [email or web address] Date: 06/28/2022 Prepared by: Gillermo Murdoch  Program Notes Avoid locking knees when standing nerve stretch lying on back - tuck chin, tip and turn head to left, right arm out to side in comfortable position mild stretch hold 1 min x 2 x twice a day   Exercises - Seated Cervical Retraction  - 3 x daily - 7 x weekly - 1 sets - 10 reps - Standing Scapular Retraction  - 3 x daily - 7 x weekly - 1 sets - 10 reps - 10 hold - Shoulder External Rotation and Scapular Retraction  - 3 x daily - 7 x weekly - 1 sets - 10 reps - 3-5 sec   hold - Shoulder External Rotation and Scapular Retraction with Resistance  - 2 x daily - 7 x weekly - 1  sets - 10 reps - 3-5 sec  hold - Shoulder External Rotation in 45 Degrees Abduction  - 2 x daily - 7 x weekly - 1-2 sets - 10 reps - 3 sec  hold - Doorway Pec Stretch at 60 Degrees Abduction  - 3 x daily - 7 x weekly - 1 sets - 3 reps - Shoulder W - External Rotation with Resistance  - 2 x daily - 7 x weekly - 1-2 sets - 10 reps - 3 sec  hold - Seated Cervical Sidebending AROM  - 2 x daily - 7 x weekly - 1 sets - 3-5 reps - 5-10 sec  hold - Standing Backward Shoulder Rolls  - 2 x daily - 7 x weekly - 1 sets - 10 reps - 1-2 sec  hold - Doorway Pec Stretch at 90 Degrees Abduction  - 3 x daily - 7 x weekly - 1 sets - 3 reps - 30 seconds   hold - Doorway Pec Stretch at 120 Degrees Abduction  - 3 x daily - 7 x weekly - 1 sets - 3 reps - 30 second hold  hold - Prone Quadriceps Stretch with Strap  - 2 x daily - 7 x weekly - 1 sets - 3 reps - 30 sec  hold - Supine Piriformis Stretch with Leg Straight  - 2 x daily - 7 x weekly - 1 sets - 3 reps - 30 sec  hold - Supine ITB Stretch with Strap  - 2 x daily - 7 x weekly - 1 sets - 3 reps - 30 sec  hold - Supine Quad Set  - 2 x daily - 7 x weekly - 1 sets - 10 reps - 3 sec  hold - Straight Leg Raise with External Rotation  - 2 x daily - 7 x weekly - 1 sets - 10 reps - 3-5 sec  hold - Wall Squat with Ball between Knees  - 2 x daily - 7 x weekly - 1-2 sets - 10 reps - 10 sec  hold - Standing Bicep Stretch at Wall  - 2 x daily - 7 x weekly - 1 sets - 3 reps - 30 sec  hold - Standing Shoulder Flexion Stretch on Wall  - 2 x daily - 7 x weekly - 2 sets - 3 reps - 10 sec  hold - Sidelying Hip Adduction  - 2 x daily - 7 x weekly - 2-3 sets - 10 reps - 3-5 sec  hold - Sit to Stand  - 2 x daily - 7 x weekly - 1 sets - 10 reps - 3-5 sec  hold - Standing Bilateral Low Shoulder Row with Anchored Resistance  - 2 x daily - 7 x weekly - 1-3 sets - 10 reps - 2-3 sec  hold - Drawing Bow  - 1 x daily - 7 x weekly - 1 sets - 10 reps - 3 sec  hold - Supine Lower Trunk Rotation  - 2 x daily - 7 x weekly - 1 sets - 3-5 reps - 20-30 sec  hold - Glute Med Wall Lean  - 2 x daily - 7 x weekly - 1-2 sets - 10 reps - 3-5 sec  hold - Sidelying Hip Abduction  - 1 x daily - 7 x weekly - 3 sets - 10 reps - 3-5 sec  hold - 3-Way Lunge on Slider  - 1 x daily - 7 x weekly - 1 sets - 10 reps -  Step Up  - 2 x daily - 7 x weekly - 1 sets - 10 reps - 2 sec  hold - Standing Shoulder External Rotation with Resistance at 45 Degrees of Abduction  - 1 x daily - 7 x weekly - 1-2 sets - 10 reps - 3 sec  hold - Hip Flexor Stretch at Edge of Bed  - 2 x daily - 7 x weekly - 1 sets - 3 reps - 30 sec  hold - Seated Hip Flexor Stretch  - 2 x  daily - 7 x weekly - 1 sets - 3 reps - 30 sec  hold - Plank with Elbows on Table  - 2 x daily - 7 x weekly - 1 sets - 3 reps - 30 sec  hold - Standing Lat Pull Down with Resistance - Elbows Bent  - 2 x daily - 7 x weekly - 1 sets - 10 reps - 3 sec  hold - Wall Angels  - 2 x daily - 7 x weekly - 1 sets - 10 reps - 2-3 sec  hold - Prone Scapular Retraction  - 2 x daily - 7 x weekly - 1 sets - 5-10 reps - 3-5 sec  hold - Trail Leg Lunge  - 1 x daily - 7 x weekly - 1-3 sets - 10 reps - 3-5 sec  hold  Patient Education - Office Posture  ASSESSMENT:  CLINICAL IMPRESSION:  No pain in shoulder or knee with normal functional activities. Has 3/10 pain in the Rt knee with stairs, squats, lunges. Taping for workouts and most days. Working on scapular stabilization ans strengthening. Progressing well with rehab. Note to MD at next visit   OBJECTIVE IMPAIRMENTS: Rt shoulder pain for the past 2 weeks with no known injury but does report similar episode of Rt shoulder pain ~ 10 years ago which resolved with rest. She has poor posture and alignment; limited Rt > Lt shoulder ROM; poor muscular balance; poor scapular control for shoulder elevation; muscular tightness with palpation; pain with functional activities. Patient reports pain in Rt knee for 1-2 years with no known injury. She stands with bilat knees in hyperextension; muscular tightness to palpation lateral Rt quad to insertion of quad at superior lateral patella; pain with patella grind; pain with ascending and descending steps; squats; lunges  GOALS: Goals reviewed with patient? Yes  SHORT TERM GOALS: Target date: 06/07/2022   Independent in initial HEP  Baseline: Goal status: MET  2.  Demonstration and verbalization of improved posture and alignment to decrease postural infulcnces for shoulder pain  Baseline:  Goal status: MET  3.  Avoid hyperextension of knees in standing  Baseline:  Goal status: MET   LONG TERM GOALS: Target date:  07/05/2022  Decrease pain in Rt shoulder and Rt knee by 75-100% allowing patient to participate in all exercise and functional activities without pain  Baseline:  Goal status: INITIAL  2.  Increase strength Rt shoulder to 5/5 with no pain  Baseline:  Goal status: INITIAL  3.  Improve posture and alignment with patient to demonstrate improved activation of posterior shoulder girdle with functional use of Rt UE Baseline:  Goal status: INITIAL  4.  Patient to tolerate ascending and descending steps; squats and lunges in the gym with no Rt knee pain  Baseline:  Goal status: INITIAL  5.  Independent in HEP  Baseline:  Goal status: INITIAL  6.  Functional limitation score 74  Baseline:  Goal status: INITIAL  PLAN:  PT FREQUENCY: 2x/week  PT DURATION: 8 weeks  PLANNED INTERVENTIONS: Therapeutic exercises, Therapeutic activity, Neuromuscular re-education, Balance training, Gait training, Patient/Family education, Self Care, Joint mobilization, Stair training, Aquatic Therapy, Dry Needling, Electrical stimulation, Spinal mobilization, Cryotherapy, Moist heat, Taping, Vasopneumatic device, Ultrasound, Ionotophoresis 4mg /ml Dexamethasone, Manual therapy, and Re-evaluation  PLAN FOR NEXT SESSION: review and progress exercises; continue with postural correction and education; manual work, DN, modalities as indicated.    Everardo All, PT 06/28/2022, 8:23 AM

## 2022-07-05 ENCOUNTER — Encounter: Payer: Self-pay | Admitting: Rehabilitative and Restorative Service Providers"

## 2022-07-05 ENCOUNTER — Ambulatory Visit: Payer: 59 | Admitting: Rehabilitative and Restorative Service Providers"

## 2022-07-05 DIAGNOSIS — M2241 Chondromalacia patellae, right knee: Secondary | ICD-10-CM | POA: Diagnosis not present

## 2022-07-05 DIAGNOSIS — M6281 Muscle weakness (generalized): Secondary | ICD-10-CM

## 2022-07-05 DIAGNOSIS — R29898 Other symptoms and signs involving the musculoskeletal system: Secondary | ICD-10-CM | POA: Diagnosis not present

## 2022-07-05 DIAGNOSIS — M7541 Impingement syndrome of right shoulder: Secondary | ICD-10-CM

## 2022-07-05 DIAGNOSIS — R293 Abnormal posture: Secondary | ICD-10-CM

## 2022-07-05 NOTE — Therapy (Addendum)
OUTPATIENT PHYSICAL THERAPY SHOULDER AND KNEE TREATMENT and DISCHARGE SUMMARY  PHYSICAL THERAPY DISCHARGE SUMMARY  Visits from Start of Care: 10  Current functional level related to goals / functional outcomes: See progress note    Remaining deficits: Non known deficits   Education / Equipment: HEP   Patient agrees to discharge. Patient goals were met. Patient is being discharged due to meeting the stated rehab goals. Adith Tejada P. Leonor Liv PT, MPH 07/05/22 8:41 AM  Patient Name: Joanna Hayes MRN: [id number] DOB:1975-07-30, 47 y.o., female Today's Date: 07/05/2022  END OF SESSION:  PT End of Session - 07/05/22 0805     Visit Number 10    Number of Visits 16    Date for PT Re-Evaluation 07/05/22    Authorization Type [facility] [employer] $40 copay    PT Start Time 0802    PT Stop Time 0845    PT Time Calculation (min) 43 min    Activity Tolerance Patient tolerated treatment well             Past Medical History:  Diagnosis Date   Normocytic anemia due to blood loss 04/27/2018   Obesity    Past Surgical History:  Procedure Laterality Date   COLONOSCOPY  03/12/2020   NO PAST SURGERIES     WISDOM TOOTH EXTRACTION     Patient Active Problem List   Diagnosis Date Noted   Impingement syndrome, shoulder, right 05/04/2022   Chondromalacia of patellofemoral joint, right 05/04/2022   Normocytic anemia due to blood loss 04/27/2018   Family history of thyroid disease 04/23/2018   Vitamin D insufficiency 04/12/2016   Intermittent low back pain 04/11/2016   Dyslipidemia 10/16/2013    PCP: Christen Butter, NP  REFERRING PROVIDER: Dr Rodney Langton  REFERRING DIAG: impingement Rt shoulder; patellofemoral chondromalacia Rt  THERAPY DIAG:  Shoulder impingement syndrome, right  Other symptoms and signs involving the musculoskeletal system  Abnormal posture  Muscle weakness (generalized)  Chondromalacia of patellofemoral joint, right  Rationale for Evaluation and Treatment:  Rehabilitation  ONSET DATE: 04/26/22 shoulder; ~03/28/21 knee   SUBJECTIVE:                                                                                                                                                                                      SUBJECTIVE STATEMENT:  Patient reports her shoulder and knee are resolved. She is exercising at home and in the gym and feels she has a good routine of exercise. She is using kinesotape on days when she is exercising but is not taping the Rt knee every day.  however she continues to have discomfort in knee with "clicking" sound when navigating stairs  and working out (I.e. lunges, squats).     PERTINENT HISTORY:  Patient reports onset of Rt shoulder pain ~ 2 weeks ago with no known injury. She noticed pain in the Rt shoulder after exercise class and pain has persisted for the past 2 weeks. Pain is improving some in the past week but still painful with elevating Rt shoulder to side with rotation.  She has Rt knee pain with certain activities including squats, ascending and descending stairs. She can hear crepitus in the knee more recently. Symptoms have been noticeable for the past 1-2 years. Now wearing a brace when she is in the gym.History of Rt shoulder pain ~ 10 years ago which resolved with rest. Denies any medical problems.   PAIN:  Are you having pain? Yes: NPRS scale: 0/10 shoulder or knee  Knee pain 0/10 with stairs, squats, lunges  Pain location: Rt shoulder; Rt knee  Pain description: shoulder sharp; knee dull, clicking  Aggravating factors: shoulder abduction with rotation; knee stairs, squats, lunges  Relieving factors: avoiding those activities   PRECAUTIONS: None  WEIGHT BEARING RESTRICTIONS: No  FALLS:  Has patient fallen in last 6 months? 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 miles daily; household chores    PATIENT  GOALS:get rid of the pain in the shoulder and knee and keep the symptoms from returning   NEXT MD VISIT: 07/11/22  OBJECTIVE:   DIAGNOSTIC FINDINGS:  Xrays: 05/04/22 - bilat knees - No evidence of fracture, dislocation, or joint effusion. No evidence of arthropathy or other focal bone abnormality. Soft tissues are unremarkable. 05/04/22 - Rt shoulder - no evidence of fracture or dislocation; no evidence of arthropathy or other focal bone abnormality. Soft tissues are unremarkable.  PATIENT SURVEYS:  FOTO 52; goal 74 06/22/22: 71 07/05/22: 75   POSTURE: Patient presents with head forward posture with increased thoracic kyphosis; shoulders rounded and elevated; scapulae abducted and rotated along the thoracic spine; head of the humerus anterior in orientation.   UPPER EXTREMITY ROM: pain and tightness Rt ROM   UPPER EXTREMITY ROM: pain and tightness Rt ROM    Active ROM Right eval Rigth 07/05/22 Left eval  Shoulder flexion 144 158 151  Shoulder extension 39 55 45  Shoulder abduction 140 147 154  Shoulder adduction       Shoulder internal rotation Thumb T10 Thumb  T6 Thumb T7  Shoulder external rotation 90 90 90  Elbow flexion       Elbow extension       Wrist flexion       Wrist extension       Wrist ulnar deviation       Wrist radial deviation       Wrist pronation       Wrist supination       (Blank rows = not tested) LE Strength:            LE strength grossly WFL's patient has muscular imbalance with lateral quads more activated than medial Rt LE  (Blank rows = not tested) LE Strength:  LE strength grossly WFL's patient has muscular imbalance with lateral quads more activated than medial Rt LE   UPPER EXTREMITY MMT: WFL's bilat shoulders except as noted; pain with resisted shoulder abduction; rotation 07/05/22 - 5/5 UE strength and no pain  MMT Right eval Right  07/05/22 Left eval  Shoulder flexion  Shoulder extension     Shoulder abduction 5-/5 pain 5/5   Shoulder  adduction     Shoulder internal rotation 5-/5 pain  5/5   Shoulder external rotation 4+/5 pain 5/5   Middle trapezius     Lower trapezius     Elbow flexion     Elbow extension     Wrist flexion     Wrist extension     Wrist ulnar deviation     Wrist radial deviation     Wrist pronation     Wrist supination     Grip strength (lbs)     (Blank rows = not tested)  LE ROM:  WFL's  Pecs/Biceps: tight Rt > Lt   PALPATION:  Shoulder: muscular tightness pecs/biceps; upper trap; leveator; teres Rt > Lt  Knee; tight with muscular banding Rt knee lateral quad insertion at superior lateral pole of patella    TODAY'S TREATMENT:    [facility] Adult PT Treatment:                                                DATE: 07/05/2022 Therapeutic Exercise: Doorway stretch 3 positions x30" x 2 each Rows BlueTB 10x3"  Bow and arrow blue TB x 10  Shoulder high row to ER to overhead yellow TB x 10  Spanish squat 5 sec x 10   Reviewed HEP prior to d/c                                                                                                 [facility] Adult PT Treatment:                                                DATE: 06/28/2022 Therapeutic Exercise:    Treadmill 2.5 mph x 6 min 3 degree incline   Blood flow restriction Rt proximal thigh  120 hg pressure; wall squat 30/15/15/15 with 30 sec rest btn sets  BFR 60% occlusion = (max )   Elevated Rear foot lunge at steps 3-5 sec hold x 10 Rt/Lt; x 10 Rt  Squat with strap behind knees for sit back to increase work through [employer] 3 sec x 10 x 2   [facility] Adult PT Treatment:                                                DATE: 06/22/2022 Therapeutic Exercise: UBE L4 x 5 min 2 min fwd/2 min back Doorway stretch 3 positions x30" each Rows BTB 10x3"  Incline plank on mat table x30" --> slow mountain climber x10 Bent arm shoulder flexion arm raises light green loop band  Arm wall clock light green loop band R pec stretch  in doorway Drawing the bow GTB  x10 B SA arm wall slides with foam roller + FOTO intake Supine R shoulder AROM IR/ER 1#DB Shoulder ER isometric walk out (R) BTB Seated shoulder ER in abd YTB (elbow propped on FR) 2x10 Posterior lunge with slider x5 (discomfort in M/L knee) R woodpecker x10 --> added trunk rotation x10                                                                                                                                   DATE: 05/19/22 (Dry Needling)  Therapeutic Exercise: Doorway stretch 3 positions 30 sec x 2 each  Shoulder flexion stretch stepping through doorway 30 sec x 3 Chin tuck with noodle 5 sec x 5 Scap squeeze with noodle 10 sec x 5 L's with noodle x 10 Scap squeeze yellow TB 3 sec x 10 x 2   W's with noodle x 10  W with noodle yellow B 3 sec x 10 x 2  Row blue TB 3 sec x 10  Bow ad arrow blue TB 3 sec x 10  Quad set with patellar taping 5 sec x 5 SLR in ER 3 sec x 10  Wall squat with ball btn knees 10 sec x 10  Sit to stand slow stand to sit x 10  Hip adduction Rt sidelying 3 sec x 10  Manual Therapy: STM to assess response to DN and manual work Psychiatrist  Treatment instructions: Expect mild to moderate muscle soreness. S/S of pneumothorax if dry needled over a lung field, and to seek immediate medical attention should they occur. Patient verbalized understanding of these instructions and education.  Patient Consent Given: Yes Education handout provided: Previously provided Muscles treated: Rt pecs; biceps tendon Electrical stimulation performed: No Parameters: N/A Treatment response/outcome: decreased palpable tightness  Taping Rt knee to improve patellar alignment Rock tape  Deep tissue work- transverse friction massage Lateral Rt quad at patellar insertion ~ 1 min 2x/day Neuromuscular re-ed: Activation of medial quad Therapeutic Activity: Myofacial ball release work standing for Rt shoulder girdle  Modalities:  Self Care: Avoid standing with knees  hyperextended    PATIENT EDUCATION: Education details: POC; HEP  Person educated: Patient Education method: Explanation, Demonstration, Tactile cues, Verbal cues, and Handouts Education comprehension: verbalized understanding, returned demonstration, verbal cues required, tactile cues required, and needs further education  HOME EXERCISE PROGRAM: Access Code: [email or web address] URL: [email or web address] Date: 07/05/2022 Prepared by: Corlis Leak  Program Notes Avoid locking knees when standing nerve stretch lying on back - tuck chin, tip and turn head to left, right arm out to side in comfortable position mild stretch hold 1 min x 2 x twice a day   Exercises - Seated Cervical Retraction  - 3 x daily - 7 x weekly - 1 sets - 10 reps - Standing Scapular Retraction  - 3 x daily - 7 x weekly - 1 sets - 10  reps - 10 hold - Shoulder External Rotation and Scapular Retraction  - 3 x daily - 7 x weekly - 1 sets - 10 reps - 3-5 sec   hold - Shoulder External Rotation and Scapular Retraction with Resistance  - 2 x daily - 7 x weekly - 1 sets - 10 reps - 3-5 sec  hold - Shoulder External Rotation in 45 Degrees Abduction  - 2 x daily - 7 x weekly - 1-2 sets - 10 reps - 3 sec  hold - Doorway Pec Stretch at 60 Degrees Abduction  - 3 x daily - 7 x weekly - 1 sets - 3 reps - Shoulder W - External Rotation with Resistance  - 2 x daily - 7 x weekly - 1-2 sets - 10 reps - 3 sec  hold - Seated Cervical Sidebending AROM  - 2 x daily - 7 x weekly - 1 sets - 3-5 reps - 5-10 sec  hold - Standing Backward Shoulder Rolls  - 2 x daily - 7 x weekly - 1 sets - 10 reps - 1-2 sec  hold - Doorway Pec Stretch at 90 Degrees Abduction  - 3 x daily - 7 x weekly - 1 sets - 3 reps - 30 seconds  hold - Doorway Pec Stretch at 120 Degrees Abduction  - 3 x daily - 7 x weekly - 1 sets - 3 reps - 30 second hold  hold - Prone Quadriceps Stretch with Strap  - 2 x daily - 7 x weekly - 1 sets - 3 reps - 30 sec  hold - Supine  Piriformis Stretch with Leg Straight  - 2 x daily - 7 x weekly - 1 sets - 3 reps - 30 sec  hold - Supine ITB Stretch with Strap  - 2 x daily - 7 x weekly - 1 sets - 3 reps - 30 sec  hold - Supine Quad Set  - 2 x daily - 7 x weekly - 1 sets - 10 reps - 3 sec  hold - Straight Leg Raise with External Rotation  - 2 x daily - 7 x weekly - 1 sets - 10 reps - 3-5 sec  hold - Wall Squat with Ball between Knees  - 2 x daily - 7 x weekly - 1-2 sets - 10 reps - 10 sec  hold - Standing Bicep Stretch at Wall  - 2 x daily - 7 x weekly - 1 sets - 3 reps - 30 sec  hold - Standing Shoulder Flexion Stretch on Wall  - 2 x daily - 7 x weekly - 2 sets - 3 reps - 10 sec  hold - Sidelying Hip Adduction  - 2 x daily - 7 x weekly - 2-3 sets - 10 reps - 3-5 sec  hold - Sit to Stand  - 2 x daily - 7 x weekly - 1 sets - 10 reps - 3-5 sec  hold - Standing Bilateral Low Shoulder Row with Anchored Resistance  - 2 x daily - 7 x weekly - 1-3 sets - 10 reps - 2-3 sec  hold - Drawing Bow  - 1 x daily - 7 x weekly - 1 sets - 10 reps - 3 sec  hold - Supine Lower Trunk Rotation  - 2 x daily - 7 x weekly - 1 sets - 3-5 reps - 20-30 sec  hold - Glute Med Wall Lean  - 2 x daily - 7 x weekly - 1-2 sets -  10 reps - 3-5 sec  hold - Sidelying Hip Abduction  - 1 x daily - 7 x weekly - 3 sets - 10 reps - 3-5 sec  hold - 3-Way Lunge on Slider  - 1 x daily - 7 x weekly - 1 sets - 10 reps - Step Up  - 2 x daily - 7 x weekly - 1 sets - 10 reps - 2 sec  hold - Standing Shoulder External Rotation with Resistance at 45 Degrees of Abduction  - 1 x daily - 7 x weekly - 1-2 sets - 10 reps - 3 sec  hold - Hip Flexor Stretch at Edge of Bed  - 2 x daily - 7 x weekly - 1 sets - 3 reps - 30 sec  hold - Seated Hip Flexor Stretch  - 2 x daily - 7 x weekly - 1 sets - 3 reps - 30 sec  hold - Plank with Elbows on Table  - 2 x daily - 7 x weekly - 1 sets - 3 reps - 30 sec  hold - Standing Lat Pull Down with Resistance - Elbows Bent  - 2 x daily - 7 x weekly - 1  sets - 10 reps - 3 sec  hold - Wall Angels  - 2 x daily - 7 x weekly - 1 sets - 10 reps - 2-3 sec  hold - Prone Scapular Retraction  - 2 x daily - 7 x weekly - 1 sets - 5-10 reps - 3-5 sec  hold - Trail Leg Lunge  - 1 x daily - 7 x weekly - 1-3 sets - 10 reps - 3-5 sec  hold - Anterior Shoulder and Biceps Stretch  - 1 x daily - 7 x weekly - 1 sets - 3 reps - 30 sec  hold - Shoulder External Rotation in Abduction with Anchored Resistance  - 1 x daily - 7 x weekly - 1-3 sets - 10 reps - 3 sec  hold  Patient Education - Office Posture - Dry needling   ASSESSMENT:  CLINICAL IMPRESSION:  Excellent progress with rehab. No pain in shoulder or knee with normal functional activities and gym program. She has no pain in the Rt knee with stairs, squats, lunges when taping knee to improve patella tracking. Working on scapular stabilization ans strengthening. Goals of therapy have been accomplished.   OBJECTIVE IMPAIRMENTS: Rt shoulder pain for the past 2 weeks with no known injury but does report similar episode of Rt shoulder pain ~ 10 years ago which resolved with rest. She has poor posture and alignment; limited Rt > Lt shoulder ROM; poor muscular balance; poor scapular control for shoulder elevation; muscular tightness with palpation; pain with functional activities. Patient reports pain in Rt knee for 1-2 years with no known injury. She stands with bilat knees in hyperextension; muscular tightness to palpation lateral Rt quad to insertion of quad at superior lateral patella; pain with patella grind; pain with ascending and descending steps; squats; lunges  GOALS: Goals reviewed with patient? Yes  SHORT TERM GOALS: Target date: 06/07/2022   Independent in initial HEP  Baseline: Goal status: MET  2.  Demonstration and verbalization of improved posture and alignment to decrease postural influences for shoulder pain  Baseline:  Goal status: MET  3.  Avoid hyperextension of knees in standing   Baseline:  Goal status: MET   LONG TERM GOALS: Target date: 07/05/2022  Decrease pain in Rt shoulder and Rt knee by 75-100% allowing  patient to participate in all exercise and functional activities without pain  Baseline:  Goal status: MET  2.  Increase strength Rt shoulder to 5/5 with no pain  Baseline:  Goal status: MET  3.  Improve posture and alignment with patient to demonstrate improved activation of posterior shoulder girdle with functional use of Rt UE Baseline:  Goal status: MET  4.  Patient to tolerate ascending and descending steps; squats and lunges in the gym with no Rt knee pain  Baseline:  Goal status: MET  5.  Independent in HEP  Baseline:  Goal status: MET  6.  Functional limitation score 74  Baseline: 07/05/22 75 Goal status: MET  PLAN:  PT FREQUENCY: 2x/week  PT DURATION: 8 weeks  PLANNED INTERVENTIONS: Therapeutic exercises, Therapeutic activity, Neuromuscular re-education, Balance training, Gait training, Patient/Family education, Self Care, Joint mobilization, Stair training, Aquatic Therapy, Dry Needling, Electrical stimulation, Spinal mobilization, Cryotherapy, Moist heat, Taping, Vasopneumatic device, Ultrasound, Ionotophoresis 4mg /ml Dexamethasone, Manual therapy, and Re-evaluation  PLAN FOR NEXT SESSION: review and progress exercises; continue with postural correction and education; manual work, DN, modalities as indicated.    Zorawar Strollo Rober Minion, PT 07/05/2022, 8:06 AM

## 2022-07-11 ENCOUNTER — Ambulatory Visit (INDEPENDENT_AMBULATORY_CARE_PROVIDER_SITE_OTHER): Payer: 59 | Admitting: Sports Medicine

## 2022-07-11 DIAGNOSIS — M7541 Impingement syndrome of right shoulder: Secondary | ICD-10-CM | POA: Diagnosis not present

## 2022-07-11 DIAGNOSIS — M2241 Chondromalacia patellae, right knee: Secondary | ICD-10-CM | POA: Diagnosis not present

## 2022-07-11 NOTE — Assessment & Plan Note (Signed)
Resolved with PT, return as needed.

## 2022-07-11 NOTE — Progress Notes (Signed)
    Procedures performed today:    None.  Independent interpretation of notes and tests performed by another provider:   None.  Brief History, Exam, Impression, and Recommendations:    Chondromalacia of patellofemoral joint, right Resolved with physical therapy, return as needed.  Impingement syndrome, shoulder, right Resolved with PT, return as needed.    ____________________________________________ Ihor Austin. Benjamin Stain, M.D., ABFM., CAQSM., AME. Primary Care and Sports Medicine [facility]  Adjunct Professor of Family Medicine  [employer]  Restaurant manager, [employer]

## 2022-07-11 NOTE — Assessment & Plan Note (Signed)
Resolved with physical therapy, return as needed.

## 2022-07-22 IMAGING — MG MM DIGITAL SCREENING BILAT W/ TOMO AND CAD
8 series · 8 of 24 positions shown · non-contrast
Comparison: Previous exam(s).

CLINICAL DATA: Screening.

EXAM:
DIGITAL SCREENING BILATERAL MAMMOGRAM WITH TOMOSYNTHESIS AND CAD
TECHNIQUE: Bilateral screening digital craniocaudal and mediolateral oblique
mammograms were obtained. Bilateral screening digital breast
tomosynthesis was performed. The images were evaluated with
computer-aided detection.

[L CC synth-2D]
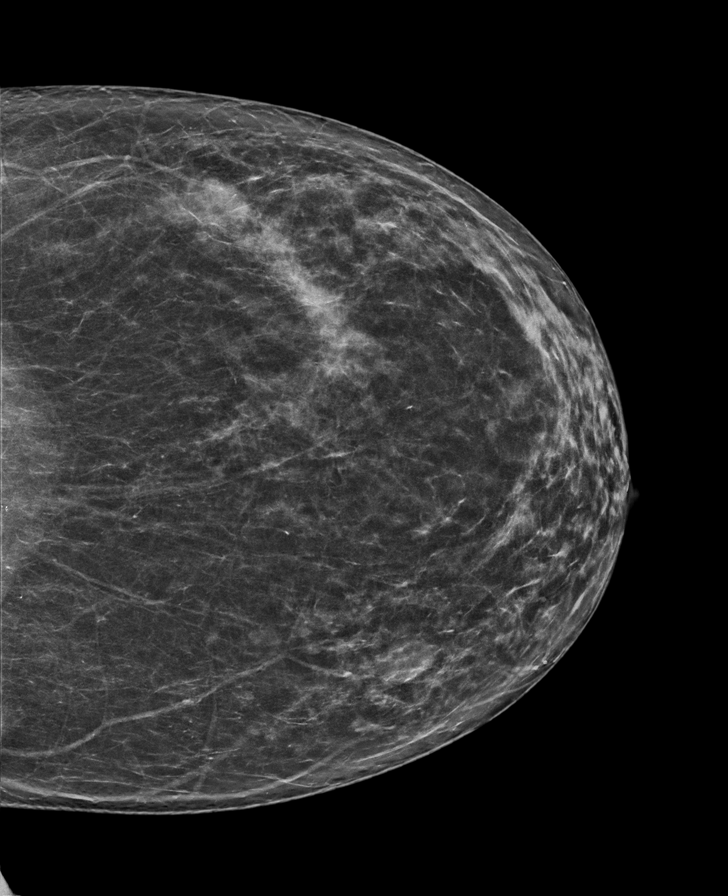

[L MLO synth-2D]
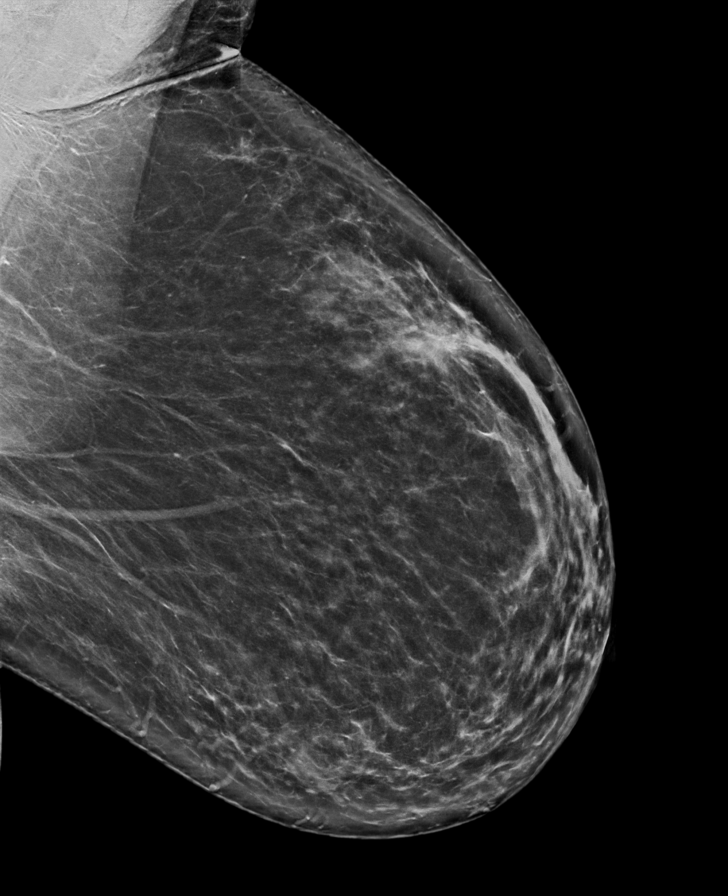

[R MLO synth-2D]
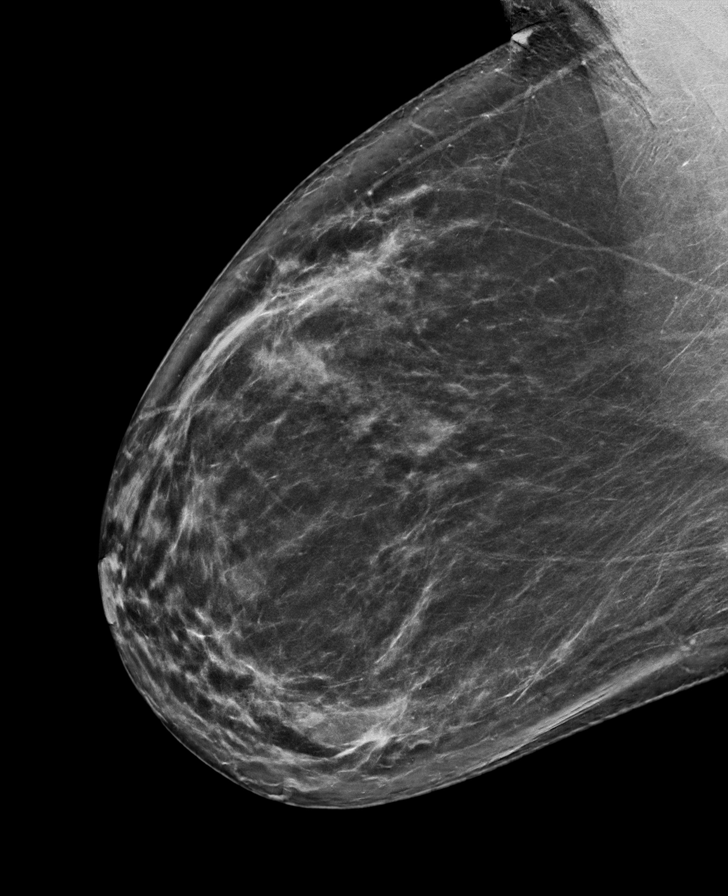

[R CC synth-2D]
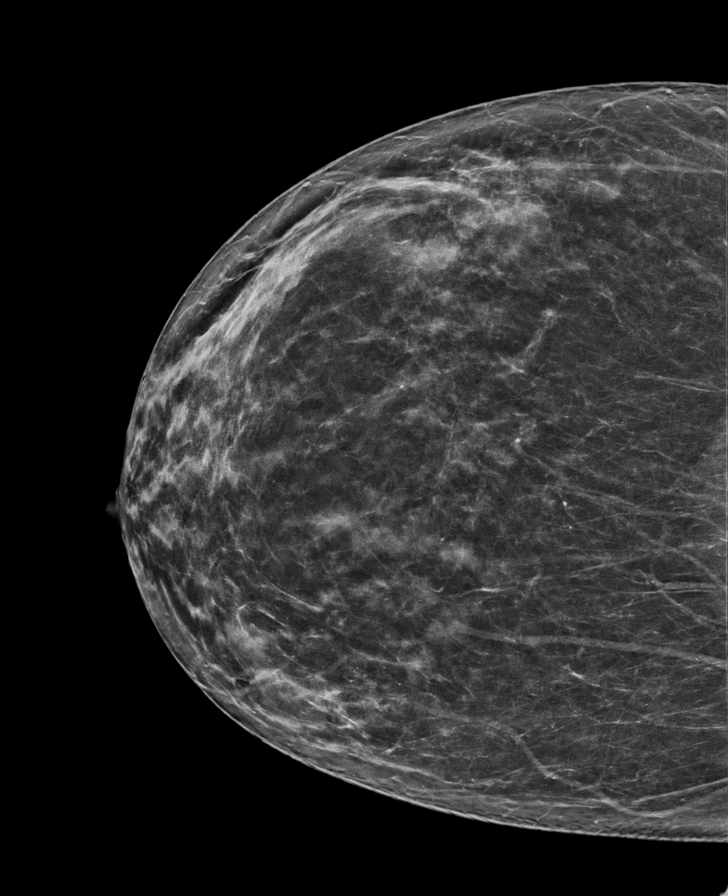

[L CC tomo · tomo slice 39/78.0]
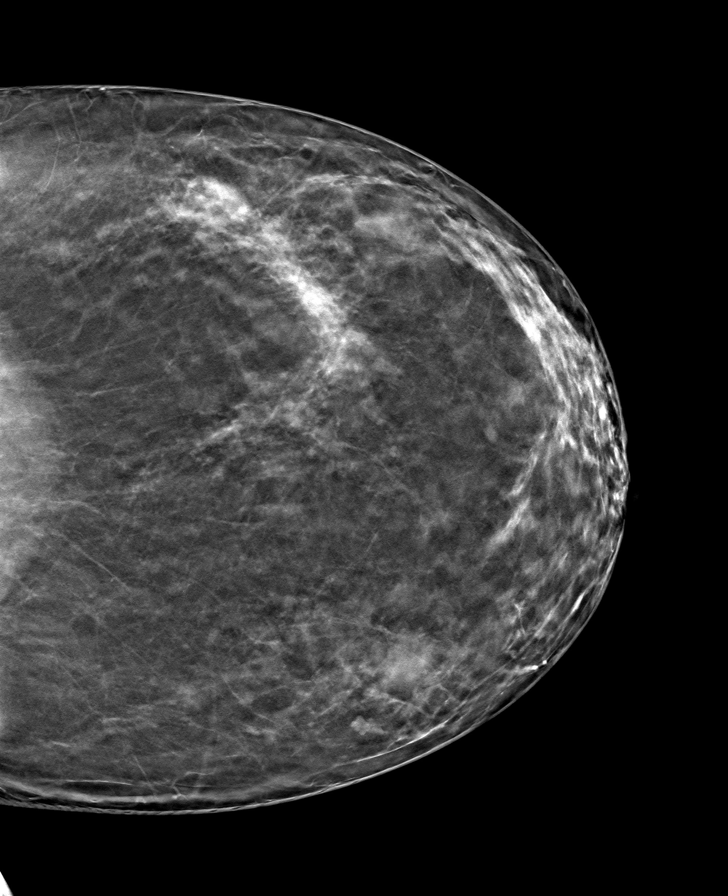

[R MLO tomo · tomo slice 47/92.0]
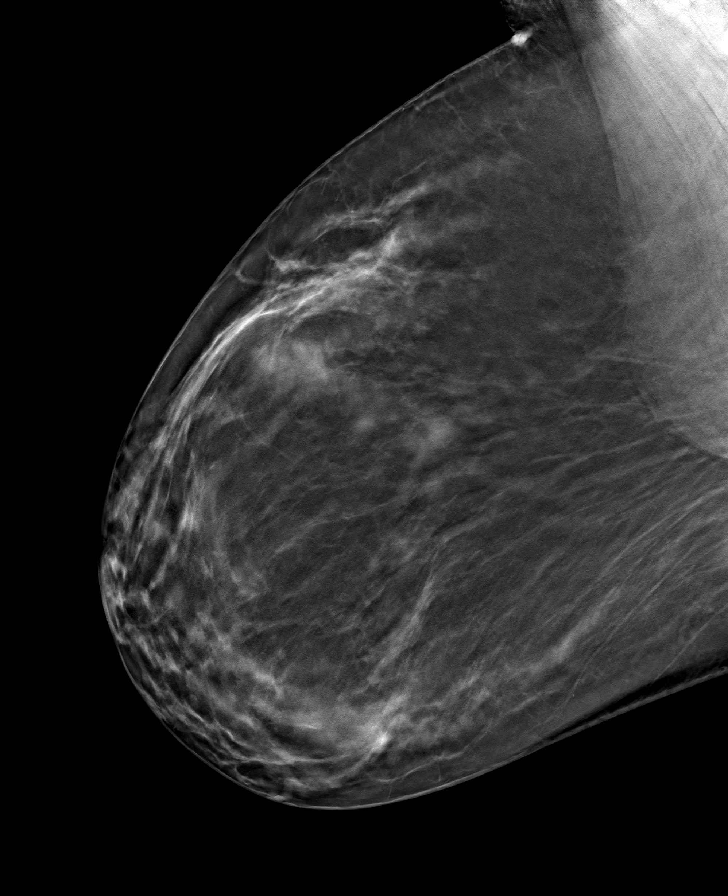

[R CC tomo · tomo slice 39/78.0]
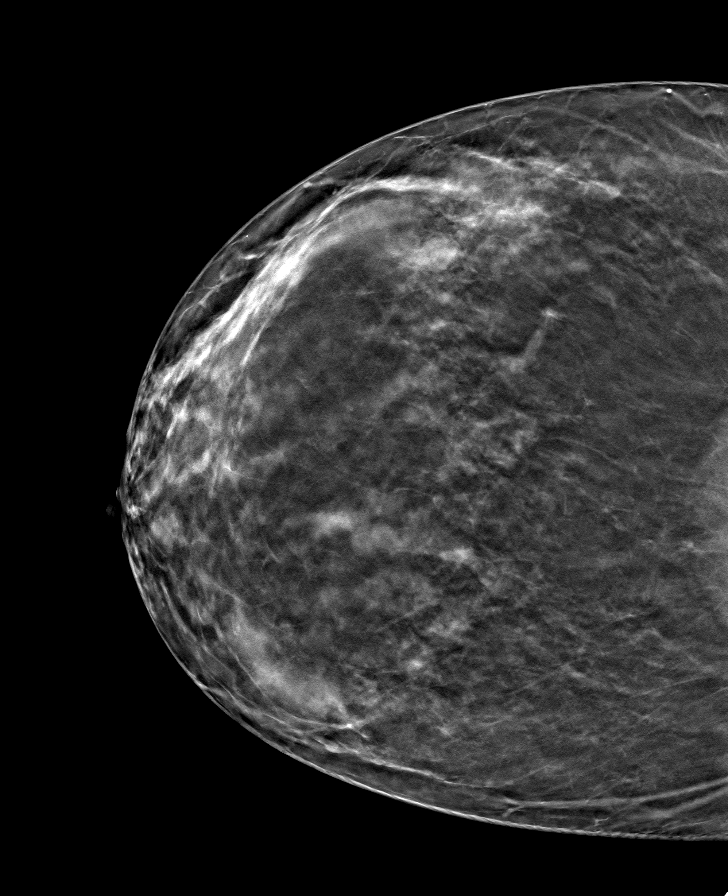

[L MLO tomo · tomo slice 45/89.0]
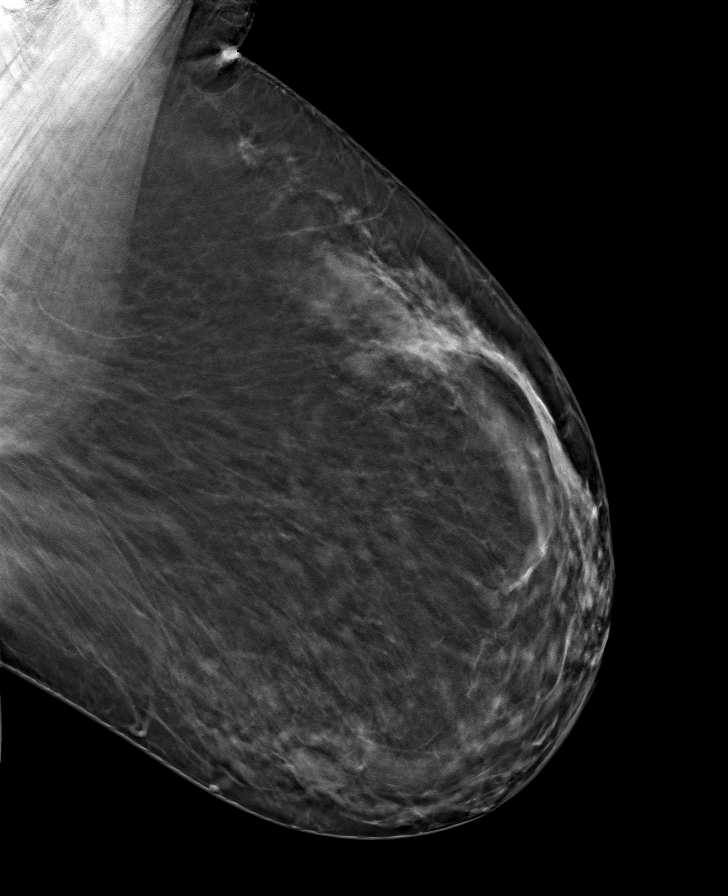

[8 of 24 positions shown; findings below may reference images not displayed]

ACR Breast Density Category b: There are scattered areas of
fibroglandular density.
FINDINGS: There are no findings suspicious for malignancy.
IMPRESSION: No mammographic evidence of malignancy. A result letter of this
screening mammogram will be mailed directly to the patient.

RECOMMENDATION:
Screening mammogram in one year. (Code:51-O-LD2)

BI-RADS CATEGORY  1: Negative.

## 2022-11-22 ENCOUNTER — Other Ambulatory Visit: Payer: Self-pay | Admitting: Certified Nurse Midwife

## 2022-11-22 DIAGNOSIS — Z1231 Encounter for screening mammogram for malignant neoplasm of breast: Secondary | ICD-10-CM

## 2022-11-29 DIAGNOSIS — H52223 Regular astigmatism, bilateral: Secondary | ICD-10-CM | POA: Diagnosis not present

## 2022-12-05 ENCOUNTER — Encounter: Payer: Self-pay | Admitting: Medical-Surgical

## 2022-12-05 ENCOUNTER — Ambulatory Visit (INDEPENDENT_AMBULATORY_CARE_PROVIDER_SITE_OTHER): Payer: 59

## 2022-12-05 ENCOUNTER — Ambulatory Visit (INDEPENDENT_AMBULATORY_CARE_PROVIDER_SITE_OTHER): Payer: 59 | Admitting: Medical-Surgical

## 2022-12-05 ENCOUNTER — Other Ambulatory Visit (HOSPITAL_BASED_OUTPATIENT_CLINIC_OR_DEPARTMENT_OTHER): Payer: Self-pay

## 2022-12-05 VITALS — BP 113/72 | HR 75 | Resp 20 | Ht 63.0 in | Wt 214.6 lb

## 2022-12-05 DIAGNOSIS — R600 Localized edema: Secondary | ICD-10-CM

## 2022-12-05 DIAGNOSIS — E010 Iodine-deficiency related diffuse (endemic) goiter: Secondary | ICD-10-CM | POA: Insufficient documentation

## 2022-12-05 DIAGNOSIS — E559 Vitamin D deficiency, unspecified: Secondary | ICD-10-CM

## 2022-12-05 DIAGNOSIS — Z8349 Family history of other endocrine, nutritional and metabolic diseases: Secondary | ICD-10-CM

## 2022-12-05 DIAGNOSIS — E785 Hyperlipidemia, unspecified: Secondary | ICD-10-CM

## 2022-12-05 DIAGNOSIS — Z0000 Encounter for general adult medical examination without abnormal findings: Secondary | ICD-10-CM

## 2022-12-05 DIAGNOSIS — E042 Nontoxic multinodular goiter: Secondary | ICD-10-CM | POA: Diagnosis not present

## 2022-12-05 MED ORDER — FUROSEMIDE 20 MG PO TABS
20.0000 mg | ORAL_TABLET | Freq: Every day | ORAL | 3 refills | Status: AC | PRN
  Filled 2022-12-05: qty 30, 30d supply, fill #0

## 2022-12-05 NOTE — Progress Notes (Signed)
Complete physical exam  Patient: Joanna Hayes   DOB: [birth year]   47 y.o. Female  MRN: [id number]  Subjective:    Chief Complaint  Patient presents with   Annual Exam   Abeni Bowes is a 47 y.o. female who presents today for a complete physical exam. She reports consuming a general diet.  Cardio and strength classes, daily walks 1-3 miles.  She generally feels well. She reports sleeping well. She does not have additional problems to discuss today.    Most recent fall risk assessment:    12/05/2022    8:30 AM  Fall Risk   Falls in the past year? 0  Number falls in past yr: 0  Injury with Fall? 0  Risk for fall due to : No Fall Risks  Follow up Falls evaluation completed     Most recent depression screenings:    12/05/2022    8:30 AM 12/01/2021    8:47 AM  PHQ 2/9 Scores  PHQ - 2 Score 0 0    Vision:Within last year, Dental: No current dental problems and Receives regular dental care, and STD: The patient denies history of sexually transmitted disease.    Patient Care Team: Christen Butter, NP as PCP - General (Nurse Practitioner) Allie Bossier, MD as Consulting Physician (Obstetrics and Gynecology)   Outpatient Medications Prior to Visit  Medication Sig   Ascorbic Acid (VITAMIN C PO) Take by mouth.   cetirizine (ZYRTEC) 10 MG chewable tablet Chew 10 mg by mouth daily.   cholecalciferol (VITAMIN D) 1000 units tablet Take 1,000 Units by mouth daily.   ELDERBERRY PO Take by mouth daily.   levonorgestrel (LILETTA) 19.5 MCG/DAY IUD IUD 1 each by Intrauterine route once.   Omega-3 Fatty Acids (FISH OIL) 1000 MG CAPS Take 1 capsule by mouth.   [DISCONTINUED] meloxicam (MOBIC) 15 MG tablet Take 1 tablet by mouth every day with a meal for 2 weeks then take 1 tablet by mouth as needed for pain daily   No facility-administered medications prior to visit.   Review of Systems  Constitutional:  Negative for chills, fever, malaise/fatigue and weight loss.  HENT:  Negative for congestion,  ear pain, hearing loss, sinus pain and sore throat.   Eyes:  Negative for blurred vision, photophobia and pain.  Respiratory:  Negative for cough, shortness of breath and wheezing.   Cardiovascular:  Positive for leg swelling. Negative for chest pain and palpitations.  Gastrointestinal:  Negative for abdominal pain, constipation, diarrhea, heartburn, nausea and vomiting.  Genitourinary:  Negative for dysuria, frequency and urgency.  Musculoskeletal:  Positive for back pain. Negative for falls and neck pain.  Skin:  Negative for itching and rash.  Neurological:  Negative for dizziness, weakness and headaches.  Endo/Heme/Allergies:  Negative for polydipsia. Does not bruise/bleed easily.  Psychiatric/Behavioral:  Negative for depression, substance abuse and suicidal ideas. The patient is not nervous/anxious.      Objective:    BP 113/72 (BP Location: Right Arm, Cuff Size: Normal)   Pulse 75   Resp 20   Ht 5\' 3"  (1.6 m)   Wt 214 lb 9.6 oz (97.3 kg)   SpO2 99%   BMI 38.01 kg/m    Physical Exam Vitals reviewed.  Constitutional:      General: She is not in acute distress.    Appearance: Normal appearance. She is not ill-appearing.  HENT:     Head: Normocephalic and atraumatic.     Right Ear: Tympanic membrane, ear canal and  external ear normal. There is no impacted cerumen.     Left Ear: Tympanic membrane, ear canal and external ear normal. There is no impacted cerumen.     Nose: Nose normal. No congestion or rhinorrhea.     Mouth/Throat:     Mouth: Mucous membranes are moist.     Pharynx: No oropharyngeal exudate or posterior oropharyngeal erythema.  Eyes:     General: No scleral icterus.       Right eye: No discharge.        Left eye: No discharge.     Extraocular Movements: Extraocular movements intact.     Conjunctiva/sclera: Conjunctivae normal.     Pupils: Pupils are equal, round, and reactive to light.  Neck:     Thyroid: No thyromegaly.     Vascular: No carotid bruit or  JVD.     Trachea: Trachea normal.  Cardiovascular:     Rate and Rhythm: Normal rate and regular rhythm.     Pulses: Normal pulses.     Heart sounds: Normal heart sounds. No murmur heard.    No friction rub. No gallop.  Pulmonary:     Effort: Pulmonary effort is normal. No respiratory distress.     Breath sounds: Normal breath sounds. No wheezing.  Abdominal:     General: Bowel sounds are normal. There is no distension.     Palpations: Abdomen is soft.     Tenderness: There is no abdominal tenderness. There is no guarding.  Musculoskeletal:        General: Normal range of motion.     Cervical back: Normal range of motion and neck supple.  Lymphadenopathy:     Cervical: No cervical adenopathy.  Skin:    General: Skin is warm and dry.  Neurological:     Mental Status: She is alert and oriented to person, place, and time.     Cranial Nerves: No cranial nerve deficit.  Psychiatric:        Mood and Affect: Mood normal.        Behavior: Behavior normal.        Thought Content: Thought content normal.        Judgment: Judgment normal.   No results found for any visits on 12/05/22.     Assessment & Plan:    Routine Health Maintenance and Physical Exam  Immunization History  Administered Date(s) Administered   Influenza Whole 12/26/2012   Influenza-Unspecified 01/07/2019   PFIZER(Purple Top)SARS-COV-2 Vaccination 04/18/2019, 05/09/2019, 04/27/2020   Tdap 12/01/2020    Health Maintenance  Topic Date Due   COVID-19 Vaccine (4 - 2023-24 season) 12/21/2022 (Originally 11/27/2022)   INFLUENZA VACCINE  06/26/2023 (Originally 10/27/2022)   PAP SMEAR-Modifier  11/25/2024   Colonoscopy  03/12/2025   DTaP/Tdap/Td (2 - Td or Tdap) 12/02/2030   Hepatitis C Screening  Completed   HIV Screening  Completed   HPV VACCINES  Aged Out    Discussed health benefits of physical activity, and encouraged her to engage in regular exercise appropriate for her age and condition.  1. Annual physical  exam Checking labs as below.  Up-to-date on preventative care.  Wellness information provided with AVS. - CMP14+EGFR - CBC with Differential/Platelet  2. Dyslipidemia History of dyslipidemia.  Checking lipid panel today. - Lipid panel  3. Vitamin D insufficiency Rechecking vitamin D levels. - VITAMIN D 25 Hydroxy (Vit-D Deficiency, Fractures)  4. Family history of thyroid disease History of thyroid disease in multiple first-degree family members.  Checking TSH today. -  TSH - US THYROID; Future  5. Lower extremity edema History of intermittent lower extremity edema over the past several years.  Previously treated with furosemide 20 mg daily as needed which was very helpful.  Reports she still has lower extremity edema on occasion, especially since working from home.  Refilling furosemide 20 mg daily as needed.  If this becomes more regular, would recommend further evaluation.  6. Thyromegaly On exam, her thyroid appears to be somewhat enlarged.  With her strong family history of thyroid disease, plan for ultrasound for further evaluation. - US THYROID; Future  Return in about 1 year (around 12/05/2023) for annual physical exam.   Christen Butter, NP

## 2022-12-06 LAB — CMP14+EGFR
ALT: 10 IU/L (ref 0–32)
AST: 16 IU/L (ref 0–40)
Albumin: 4.2 g/dL (ref 3.9–4.9)
Alkaline Phosphatase: 87 IU/L (ref 44–121)
BUN/Creatinine Ratio: 10 (ref 9–23)
BUN: 9 mg/dL (ref 6–24)
Bilirubin Total: 0.4 mg/dL (ref 0.0–1.2)
CO2: 23 mmol/L (ref 20–29)
Calcium: 9.1 mg/dL (ref 8.7–10.2)
Chloride: 103 mmol/L (ref 96–106)
Creatinine, Ser: 0.88 mg/dL (ref 0.57–1.00)
Globulin, Total: 2.2 g/dL (ref 1.5–4.5)
Glucose: 81 mg/dL (ref 70–99)
Potassium: 3.9 mmol/L (ref 3.5–5.2)
Sodium: 137 mmol/L (ref 134–144)
Total Protein: 6.4 g/dL (ref 6.0–8.5)
eGFR: 83 mL/min/{1.73_m2} (ref >59)

## 2022-12-06 LAB — LIPID PANEL
Chol/HDL Ratio: 4 ratio (ref 0.0–4.4)
Cholesterol, Total: 186 mg/dL (ref 100–199)
HDL: 47 mg/dL (ref >39)
LDL Chol Calc (NIH): 129 mg/dL — ABNORMAL HIGH (ref 0–99)
Triglycerides: 50 mg/dL (ref 0–149)
VLDL Cholesterol Cal: 10 mg/dL (ref 5–40)

## 2022-12-06 LAB — CBC WITH DIFFERENTIAL/PLATELET
Basophils Absolute: 0 10*3/uL (ref 0.0–0.2)
Basos: 1 %
EOS (ABSOLUTE): 0.2 10*3/uL (ref 0.0–0.4)
Eos: 5 %
Hematocrit: 39.3 % (ref 34.0–46.6)
Hemoglobin: 12.6 g/dL (ref 11.1–15.9)
Immature Grans (Abs): 0 10*3/uL (ref 0.0–0.1)
Immature Granulocytes: 0 %
Lymphocytes Absolute: 1.8 10*3/uL (ref 0.7–3.1)
Lymphs: 39 %
MCH: 30.7 pg (ref 26.6–33.0)
MCHC: 32.1 g/dL (ref 31.5–35.7)
MCV: 96 fL (ref 79–97)
Monocytes Absolute: 0.4 10*3/uL (ref 0.1–0.9)
Monocytes: 9 %
Neutrophils Absolute: 2.2 10*3/uL (ref 1.4–7.0)
Neutrophils: 46 %
Platelets: 287 10*3/uL (ref 150–450)
RBC: 4.11 x10E6/uL (ref 3.77–5.28)
RDW: 12.6 % (ref 11.7–15.4)
WBC: 4.7 10*3/uL (ref 3.4–10.8)

## 2022-12-06 LAB — TSH: TSH: 1.17 u[IU]/mL (ref 0.450–4.500)

## 2022-12-06 LAB — VITAMIN D 25 HYDROXY (VIT D DEFICIENCY, FRACTURES): Vit D, 25-Hydroxy: 45.2 ng/mL (ref 30.0–100.0)

## 2022-12-26 ENCOUNTER — Telehealth: Payer: 59 | Admitting: Emergency Medicine

## 2022-12-26 DIAGNOSIS — J329 Chronic sinusitis, unspecified: Secondary | ICD-10-CM

## 2022-12-26 MED ORDER — AMOXICILLIN-POT CLAVULANATE 875-125 MG PO TABS: 1.0000 | ORAL_TABLET | Freq: Two times a day (BID) | ORAL | 0 refills | Status: DC

## 2022-12-26 NOTE — Progress Notes (Signed)
Virtual Visit Consent   Joanna Hayes, you are scheduled for a virtual visit with a [facility] provider today. Just as with appointments in the office, your consent must be obtained to participate. Your consent will be active for this visit and any virtual visit you may have with one of our providers in the next 365 days. If you have a MyChart account, a copy of this consent can be sent to you electronically.  As this is a virtual visit, video technology does not allow for your provider to perform a traditional examination. This may limit your provider's ability to fully assess your condition. If your provider identifies any concerns that need to be evaluated in person or the need to arrange testing (such as labs, EKG, etc.), we will make arrangements to do so. Although advances in technology are sophisticated, we cannot ensure that it will always work on either your end or our end. If the connection with a video visit is poor, the visit may have to be switched to a telephone visit. With either a video or telephone visit, we are not always able to ensure that we have a secure connection.  By engaging in this virtual visit, you consent to the provision of healthcare and authorize for your insurance to be billed (if applicable) for the services provided during this visit. Depending on your insurance coverage, you may receive a charge related to this service.  I need to obtain your verbal consent now. Are you willing to proceed with your visit today? Karyss Frese has provided verbal consent on 12/26/2022 for a virtual visit (video or telephone). Roxy Horseman, PA-C  Date: 12/26/2022 10:42 AM  Virtual Visit via Video Note   I, Roxy Horseman, connected with  Joanna Hayes  ([id number], 1975/08/15) on 12/26/22 at 10:45 AM EDT by a video-enabled telemedicine application and verified that I am speaking with the correct person using two identifiers.  Location: Patient: Virtual Visit Location Patient:  Home Provider: Virtual Visit Location Provider: Home Office   I discussed the limitations of evaluation and management by telemedicine and the availability of in person appointments. The patient expressed understanding and agreed to proceed.    History of Present Illness: Joanna Hayes is a 47 y.o. who identifies as a female who was assigned female at birth, and is being seen today for sinus congestion.  Thinks that she has sinus infection.  Reports sinus pressure in the face and ears.  Symptoms have been going on for about 8 days now.  Reports using mucinex, nasal spray, and sinus lavage with thick yellow mucous.  HPI: HPI  Problems:  Patient Active Problem List   Diagnosis Date Noted   Lower extremity edema 12/05/2022   Thyromegaly 12/05/2022   Impingement syndrome, shoulder, right 05/04/2022   Chondromalacia of patellofemoral joint, right 05/04/2022   Family history of thyroid disease 04/23/2018   Vitamin D insufficiency 04/12/2016   Intermittent low back pain 04/11/2016   Dyslipidemia 10/16/2013    Allergies: No Known Allergies Medications:  Current Outpatient Medications:    Ascorbic Acid (VITAMIN C PO), Take by mouth., Disp: , Rfl:    cetirizine (ZYRTEC) 10 MG chewable tablet, Chew 10 mg by mouth daily., Disp: , Rfl:    cholecalciferol (VITAMIN D) 1000 units tablet, Take 1,000 Units by mouth daily., Disp: , Rfl:    ELDERBERRY PO, Take by mouth daily., Disp: , Rfl:    furosemide (LASIX) 20 MG tablet, Take 1 tablet (20 mg total) by mouth daily as  needed., Disp: 30 tablet, Rfl: 3   levonorgestrel (LILETTA) 19.5 MCG/DAY IUD IUD, 1 each by Intrauterine route once., Disp: , Rfl:    Omega-3 Fatty Acids (FISH OIL) 1000 MG CAPS, Take 1 capsule by mouth., Disp: , Rfl:   Observations/Objective: Patient is well-developed, well-nourished in no acute distress.  Resting comfortably at home.  Head is normocephalic, atraumatic.  No labored breathing.  Speech is clear and coherent with logical  content.  Patient is alert and oriented at baseline.    Assessment and Plan: There are no diagnoses linked to this encounter.  Meds ordered this encounter  Medications   amoxicillin-clavulanate (AUGMENTIN) 875-125 MG tablet    Sig: Take 1 tablet by mouth every 12 (twelve) hours.    Dispense:  14 tablet    Refill:  0    Order Specific Question:   Supervising Provider    Answer:   Merrilee Jansky [id number]   Symptoms are consistent with sinusitis.  Will treat with augmentin.  Follow Up Instructions: I discussed the assessment and treatment plan with the patient. The patient was provided an opportunity to ask questions and all were answered. The patient agreed with the plan and demonstrated an understanding of the instructions.  A copy of instructions were sent to the patient via MyChart unless otherwise noted below.     The patient was advised to call back or seek an in-person evaluation if the symptoms worsen or if the condition fails to improve as anticipated.  Time:  I spent 10 minutes with the patient via telehealth technology discussing the above problems/concerns.    Roxy Horseman, PA-C

## 2022-12-26 NOTE — Patient Instructions (Signed)
  Kemper Durie, thank you for joining Roxy Horseman, PA-C for today's virtual visit.  While this provider is not your primary care provider (PCP), if your PCP is located in our provider database this encounter information will be shared with them immediately following your visit.   A [facility] MyChart account gives you access to today's visit and all your visits, tests, and labs performed at [facility] " click here if you don't have a [facility] MyChart account or go to [email or web address]  Consent: (Patient) Joanna Hayes provided verbal consent for this virtual visit at the beginning of the encounter.  Current Medications:  Current Outpatient Medications:    amoxicillin-clavulanate (AUGMENTIN) 875-125 MG tablet, Take 1 tablet by mouth every 12 (twelve) hours., Disp: 14 tablet, Rfl: 0   Ascorbic Acid (VITAMIN C PO), Take by mouth., Disp: , Rfl:    cetirizine (ZYRTEC) 10 MG chewable tablet, Chew 10 mg by mouth daily., Disp: , Rfl:    cholecalciferol (VITAMIN D) 1000 units tablet, Take 1,000 Units by mouth daily., Disp: , Rfl:    ELDERBERRY PO, Take by mouth daily., Disp: , Rfl:    furosemide (LASIX) 20 MG tablet, Take 1 tablet (20 mg total) by mouth daily as needed., Disp: 30 tablet, Rfl: 3   levonorgestrel (LILETTA) 19.5 MCG/DAY IUD IUD, 1 each by Intrauterine route once., Disp: , Rfl:    Omega-3 Fatty Acids (FISH OIL) 1000 MG CAPS, Take 1 capsule by mouth., Disp: , Rfl:    Medications ordered in this encounter:  Meds ordered this encounter  Medications   amoxicillin-clavulanate (AUGMENTIN) 875-125 MG tablet    Sig: Take 1 tablet by mouth every 12 (twelve) hours.    Dispense:  14 tablet    Refill:  0    Order Specific Question:   Supervising Provider    Answer:   Merrilee Jansky [id number]     *If you need refills on other medications prior to your next appointment, please contact your pharmacy*  Follow-Up: Call back or seek an in-person evaluation if the  symptoms worsen or if the condition fails to improve as anticipated.  [facility] [phone]  Other Instructions    If you have been instructed to have an in-person evaluation today at a local Urgent Care facility, please use the link below. It will take you to a list of all of our available [facility], including address, phone number and hours of operation. Please do not delay care.  [facility]  If you or a family member do not have a primary care provider, use the link below to schedule a visit and establish care. When you choose a [facility] primary care physician or advanced practice provider, you gain a long-term partner in health. Find a Primary Care Provider  Learn more about [facility]'s in-office and virtual care options:  - Get Care Now

## 2023-01-11 ENCOUNTER — Ambulatory Visit: Payer: 59

## 2023-01-11 DIAGNOSIS — Z1231 Encounter for screening mammogram for malignant neoplasm of breast: Secondary | ICD-10-CM

## 2023-01-20 ENCOUNTER — Other Ambulatory Visit (HOSPITAL_COMMUNITY)
Admission: RE | Admit: 2023-01-20 | Discharge: 2023-01-20 | Disposition: A | Discharge status: Home / Self Care | Payer: 59 | Source: Ambulatory Visit | Attending: Obstetrics and Gynecology | Admitting: Obstetrics and Gynecology

## 2023-01-20 ENCOUNTER — Ambulatory Visit (INDEPENDENT_AMBULATORY_CARE_PROVIDER_SITE_OTHER): Payer: 59 | Admitting: Obstetrics and Gynecology

## 2023-01-20 ENCOUNTER — Encounter: Payer: Self-pay | Admitting: Obstetrics and Gynecology

## 2023-01-20 VITALS — BP 116/78 | HR 84 | Ht 63.0 in | Wt 213.0 lb

## 2023-01-20 DIAGNOSIS — Z01419 Encounter for gynecological examination (general) (routine) without abnormal findings: Secondary | ICD-10-CM

## 2023-01-20 NOTE — Progress Notes (Signed)
GYNECOLOGY ANNUAL PREVENTATIVE CARE ENCOUNTER NOTE  History:     Joanna Hayes is a 47 y.o. G2P2 female here for a routine annual gynecologic exam.  Current complaints: None.   Denies abnormal vaginal bleeding, discharge, pelvic pain, problems with intercourse or other gynecologic concerns.   Recently got married and welcomed a new granddaughter.  Maternal aunt with breast CA. Unsure if Genetic. Will let us know and consider BRCA testing.   Gynecologic History No LMP recorded. (Menstrual status: IUD). Contraception: IUD Last Pap: 2021. Result was normal with negative HPV Last Mammogram: 01/11/2023.  Result was normal Last Colonoscopy: 2021.  Result was normal other than internal hemorrhoids.   Obstetric History OB History  Gravida Para Term Preterm AB Living  2 2       2   SAB IAB Ectopic Multiple Live Births               # Outcome Date GA Lbr Len/2nd Weight Sex Type Anes PTL Lv  2 Para      Vag-Spont     1 Para      Vag-Spont       Past Medical History:  Diagnosis Date   Normocytic anemia due to blood loss 04/27/2018   Obesity     Past Surgical History:  Procedure Laterality Date   COLONOSCOPY  03/12/2020   NO PAST SURGERIES     WISDOM TOOTH EXTRACTION      Current Outpatient Medications on File Prior to Visit  Medication Sig Dispense Refill   Ascorbic Acid (VITAMIN C PO) Take by mouth.     cetirizine (ZYRTEC) 10 MG chewable tablet Chew 10 mg by mouth daily.     cholecalciferol (VITAMIN D) 1000 units tablet Take 1,000 Units by mouth daily.     ELDERBERRY PO Take by mouth daily.     furosemide (LASIX) 20 MG tablet Take 1 tablet (20 mg total) by mouth daily as needed. 30 tablet 3   levonorgestrel (LILETTA) 19.5 MCG/DAY IUD IUD 1 each by Intrauterine route once.     Omega-3 Fatty Acids (FISH OIL) 1000 MG CAPS Take 1 capsule by mouth.     No current facility-administered medications on file prior to visit.    No Known Allergies  Social History:  reports that she  has never smoked. She has never used smokeless tobacco. She reports current alcohol use. She reports that she does not use drugs.  Family History  Problem Relation Age of Onset   Hypertension Mother    Hypertension Father    Prostate cancer Father    Colonic polyp Father    Diabetes Maternal Grandfather    Colon cancer Paternal Grandfather    AAA (abdominal aortic aneurysm) Paternal Grandfather    Breast cancer Maternal Uncle    Breast cancer Paternal Aunt    Thyroid disease Brother    Thyroid disease Sister    Esophageal cancer Neg Hx    Stomach cancer Neg Hx    Rectal cancer Neg Hx     The following portions of the patient's history were reviewed and updated as appropriate: allergies, current medications, past family history, past medical history, past social history, past surgical history and problem list.  Review of Systems Pertinent items noted in HPI and remainder of comprehensive ROS otherwise negative.  Physical Exam:  BP 116/78   Pulse 84   Ht 5\' 3"  (1.6 m)   Wt 213 lb (96.6 kg)   BMI 37.73 kg/m  CONSTITUTIONAL: Well-developed, well-nourished  female in no acute distress.  HENT:  Normocephalic, atraumatic, External right and left ear normal.  EYES: Conjunctivae and EOM are normal. Pupils are equal, round, and reactive to light. No scleral icterus.  NECK: Normal range of motion, supple, no masses.  Normal thyroid.  SKIN: Skin is warm and dry. No rash noted. Not diaphoretic. No erythema. No pallor. MUSCULOSKELETAL: Normal range of motion. No tenderness.  No cyanosis, clubbing, or edema. NEUROLOGIC: Alert and oriented to person, place, and time. Normal reflexes, muscle tone coordination.  PSYCHIATRIC: Normal mood and affect. Normal behavior. Normal judgment and thought content. CARDIOVASCULAR: Normal heart rate noted, regular rhythm RESPIRATORY: Clear to auscultation bilaterally. Effort and breath sounds normal, no problems with respiration noted. BREASTS: Symmetric in  size. No masses, tenderness, skin changes, nipple drainage, or lymphadenopathy bilaterally. Performed in the presence of a chaperone. ABDOMEN: Soft, no distention noted.  No tenderness, rebound or guarding.  PELVIC: Normal appearing external genitalia and urethral meatus; normal appearing vaginal mucosa and cervix.  No abnormal vaginal discharge noted.  Pap smear obtained.  Normal uterine size, no other palpable masses, no uterine or adnexal tenderness.  Performed in the presence of a chaperone.   Assessment and Plan:   1. Women's annual routine gynecological examination  - Cytology - PAP( [facility])   Will follow up results of pap smear and manage accordingly. Normal breast examination today, she was advised to perform periodic self breast examinations.  Mammogram complete Colon cancer screening is up to date Routine preventative health maintenance measures emphasized. Please refer to After Visit Summary for other counseling recommendations.     Audia Amick, Harolyn Rutherford, NP  [facility] for [employer], [facility]

## 2023-01-24 LAB — CYTOLOGY - PAP
Comment: NEGATIVE
Diagnosis: NEGATIVE
High risk HPV: NEGATIVE

## 2023-07-24 DIAGNOSIS — H43392 Other vitreous opacities, left eye: Secondary | ICD-10-CM | POA: Diagnosis not present

## 2023-07-24 DIAGNOSIS — M9904 Segmental and somatic dysfunction of sacral region: Secondary | ICD-10-CM | POA: Diagnosis not present

## 2023-07-24 DIAGNOSIS — M9901 Segmental and somatic dysfunction of cervical region: Secondary | ICD-10-CM | POA: Diagnosis not present

## 2023-07-24 DIAGNOSIS — M9903 Segmental and somatic dysfunction of lumbar region: Secondary | ICD-10-CM | POA: Diagnosis not present

## 2023-07-25 DIAGNOSIS — M9904 Segmental and somatic dysfunction of sacral region: Secondary | ICD-10-CM | POA: Diagnosis not present

## 2023-07-25 DIAGNOSIS — M9901 Segmental and somatic dysfunction of cervical region: Secondary | ICD-10-CM | POA: Diagnosis not present

## 2023-07-25 DIAGNOSIS — M9903 Segmental and somatic dysfunction of lumbar region: Secondary | ICD-10-CM | POA: Diagnosis not present

## 2023-07-27 DIAGNOSIS — M9904 Segmental and somatic dysfunction of sacral region: Secondary | ICD-10-CM | POA: Diagnosis not present

## 2023-07-27 DIAGNOSIS — M9903 Segmental and somatic dysfunction of lumbar region: Secondary | ICD-10-CM | POA: Diagnosis not present

## 2023-07-27 DIAGNOSIS — M9901 Segmental and somatic dysfunction of cervical region: Secondary | ICD-10-CM | POA: Diagnosis not present

## 2023-08-01 DIAGNOSIS — M9901 Segmental and somatic dysfunction of cervical region: Secondary | ICD-10-CM | POA: Diagnosis not present

## 2023-08-01 DIAGNOSIS — M9904 Segmental and somatic dysfunction of sacral region: Secondary | ICD-10-CM | POA: Diagnosis not present

## 2023-08-01 DIAGNOSIS — M9903 Segmental and somatic dysfunction of lumbar region: Secondary | ICD-10-CM | POA: Diagnosis not present

## 2023-08-03 DIAGNOSIS — M9901 Segmental and somatic dysfunction of cervical region: Secondary | ICD-10-CM | POA: Diagnosis not present

## 2023-08-03 DIAGNOSIS — M9904 Segmental and somatic dysfunction of sacral region: Secondary | ICD-10-CM | POA: Diagnosis not present

## 2023-08-03 DIAGNOSIS — M9903 Segmental and somatic dysfunction of lumbar region: Secondary | ICD-10-CM | POA: Diagnosis not present

## 2023-08-07 DIAGNOSIS — M9903 Segmental and somatic dysfunction of lumbar region: Secondary | ICD-10-CM | POA: Diagnosis not present

## 2023-08-07 DIAGNOSIS — M9904 Segmental and somatic dysfunction of sacral region: Secondary | ICD-10-CM | POA: Diagnosis not present

## 2023-08-07 DIAGNOSIS — M9901 Segmental and somatic dysfunction of cervical region: Secondary | ICD-10-CM | POA: Diagnosis not present

## 2023-08-14 ENCOUNTER — Encounter: Payer: Self-pay | Admitting: Medical-Surgical

## 2023-11-28 ENCOUNTER — Encounter: Payer: Self-pay | Admitting: Sports Medicine
